# Patient Record
Sex: Male | Born: 1970 | Race: Black or African American | Hispanic: No | Marital: Single
Health system: Midwestern US, Community
[De-identification: ages and names within clinical notes are randomized; demographics above are authoritative.]

---

## 2007-02-26 ENCOUNTER — Encounter: Payer: Self-pay | Admitting: Internal Medicine

## 2007-02-26 DIAGNOSIS — B20 Human immunodeficiency virus [HIV] disease: Secondary | ICD-10-CM | POA: Insufficient documentation

## 2007-03-04 ENCOUNTER — Encounter: Admission: RE | Admit: 2007-03-04 | Discharge: 2007-03-04 | Payer: Self-pay | Admitting: Internal Medicine

## 2007-03-04 ENCOUNTER — Ambulatory Visit: Payer: Self-pay | Admitting: Internal Medicine

## 2007-03-04 LAB — CONVERTED CEMR LAB
ALT: 25 units/L (ref 0–53)
Alkaline Phosphatase: 102 units/L (ref 39–117)
Bilirubin Urine: NEGATIVE
CO2: 23 meq/L (ref 19–32)
Chlamydia, Swab/Urine, PCR: NEGATIVE
Creatinine, Ser: 1.43 mg/dL (ref 0.40–1.50)
GC Probe Amp, Urine: NEGATIVE
HCT: 42 % (ref 39.0–52.0)
HCV Ab: NEGATIVE
HIV-2 Ab: NEGATIVE
HIV: REACTIVE
Hemoglobin: 13.9 g/dL (ref 13.0–17.0)
Hepatitis B Surface Ag: NEGATIVE
Ketones, ur: NEGATIVE mg/dL
Leukocytes, UA: NEGATIVE
Lymphs Abs: 2.6 10*3/uL (ref 0.7–3.3)
MCV: 90.7 fL (ref 78.0–100.0)
Monocytes Relative: 7 % (ref 3–11)
Neutro Abs: 2.5 10*3/uL (ref 1.7–7.7)
Nitrite: NEGATIVE
Protein, ur: NEGATIVE mg/dL
RBC: 4.63 M/uL (ref 4.22–5.81)
RDW: 14 % (ref 11.5–14.0)
Total Protein: 7.6 g/dL (ref 6.0–8.3)
Urobilinogen, UA: 0.2 (ref 0.0–1.0)
WBC: 5.7 10*3/uL (ref 4.0–10.5)

## 2007-03-22 DIAGNOSIS — R569 Unspecified convulsions: Secondary | ICD-10-CM | POA: Insufficient documentation

## 2007-03-22 DIAGNOSIS — I2510 Atherosclerotic heart disease of native coronary artery without angina pectoris: Secondary | ICD-10-CM | POA: Insufficient documentation

## 2007-03-22 DIAGNOSIS — E785 Hyperlipidemia, unspecified: Secondary | ICD-10-CM | POA: Insufficient documentation

## 2007-03-22 DIAGNOSIS — J45909 Unspecified asthma, uncomplicated: Secondary | ICD-10-CM | POA: Insufficient documentation

## 2007-03-25 ENCOUNTER — Emergency Department (HOSPITAL_COMMUNITY): Admission: EM | Admit: 2007-03-25 | Discharge: 2007-03-25 | Payer: Self-pay | Admitting: Emergency Medicine

## 2007-03-27 ENCOUNTER — Telehealth: Payer: Self-pay

## 2007-04-12 ENCOUNTER — Telehealth: Payer: Self-pay | Admitting: Internal Medicine

## 2007-04-15 ENCOUNTER — Telehealth: Payer: Self-pay | Admitting: Internal Medicine

## 2007-05-03 ENCOUNTER — Telehealth: Payer: Self-pay | Admitting: Internal Medicine

## 2007-05-03 ENCOUNTER — Ambulatory Visit: Payer: Self-pay | Admitting: Internal Medicine

## 2007-05-03 ENCOUNTER — Encounter (INDEPENDENT_AMBULATORY_CARE_PROVIDER_SITE_OTHER): Payer: Self-pay | Admitting: *Deleted

## 2007-05-03 DIAGNOSIS — K612 Anorectal abscess: Secondary | ICD-10-CM | POA: Insufficient documentation

## 2007-05-03 DIAGNOSIS — R413 Other amnesia: Secondary | ICD-10-CM | POA: Insufficient documentation

## 2007-05-10 ENCOUNTER — Telehealth: Payer: Self-pay | Admitting: Internal Medicine

## 2007-05-15 ENCOUNTER — Ambulatory Visit: Payer: Self-pay | Admitting: Internal Medicine

## 2007-05-15 ENCOUNTER — Telehealth (INDEPENDENT_AMBULATORY_CARE_PROVIDER_SITE_OTHER): Payer: Self-pay | Admitting: Pharmacy Technician

## 2007-05-15 DIAGNOSIS — R21 Rash and other nonspecific skin eruption: Secondary | ICD-10-CM | POA: Insufficient documentation

## 2007-05-19 ENCOUNTER — Emergency Department (HOSPITAL_COMMUNITY): Admission: EM | Admit: 2007-05-19 | Discharge: 2007-05-19 | Payer: Self-pay | Admitting: Emergency Medicine

## 2007-05-20 ENCOUNTER — Telehealth: Payer: Self-pay

## 2007-05-20 ENCOUNTER — Telehealth: Payer: Self-pay | Admitting: Internal Medicine

## 2007-05-20 LAB — CONVERTED CEMR LAB: RPR Ser Ql: REACTIVE — AB

## 2007-05-21 ENCOUNTER — Ambulatory Visit: Payer: Self-pay | Admitting: Internal Medicine

## 2007-05-28 ENCOUNTER — Ambulatory Visit: Payer: Self-pay | Admitting: Internal Medicine

## 2007-06-04 ENCOUNTER — Ambulatory Visit: Payer: Self-pay | Admitting: Internal Medicine

## 2007-06-17 ENCOUNTER — Encounter (INDEPENDENT_AMBULATORY_CARE_PROVIDER_SITE_OTHER): Payer: Self-pay | Admitting: *Deleted

## 2007-06-18 ENCOUNTER — Encounter (INDEPENDENT_AMBULATORY_CARE_PROVIDER_SITE_OTHER): Payer: Self-pay | Admitting: *Deleted

## 2007-06-24 ENCOUNTER — Encounter: Payer: Self-pay | Admitting: Internal Medicine

## 2007-06-26 ENCOUNTER — Encounter: Payer: Self-pay | Admitting: Internal Medicine

## 2007-06-27 ENCOUNTER — Telehealth: Payer: Self-pay | Admitting: Internal Medicine

## 2007-07-06 ENCOUNTER — Encounter: Payer: Self-pay | Admitting: Internal Medicine

## 2007-07-30 ENCOUNTER — Telehealth: Payer: Self-pay | Admitting: Internal Medicine

## 2007-08-01 ENCOUNTER — Encounter: Admission: RE | Admit: 2007-08-01 | Discharge: 2007-08-01 | Payer: Self-pay | Admitting: Internal Medicine

## 2007-08-01 ENCOUNTER — Ambulatory Visit: Payer: Self-pay | Admitting: Internal Medicine

## 2007-08-01 LAB — CONVERTED CEMR LAB
ALT: 17 units/L (ref 0–53)
Albumin: 4.4 g/dL (ref 3.5–5.2)
BUN: 11 mg/dL (ref 6–23)
Basophils Absolute: 0 10*3/uL (ref 0.0–0.1)
Basophils Relative: 1 % (ref 0–1)
Eosinophils Relative: 6 % — ABNORMAL HIGH (ref 0–5)
Glucose, Bld: 108 mg/dL — ABNORMAL HIGH (ref 70–99)
HDL: 39 mg/dL — ABNORMAL LOW (ref >39)
HIV 1 RNA Quant: <50 copies/mL (ref <50)
HIV-1 RNA Quant, Log: <1.7 (ref <1.70)
LDL Cholesterol: 125 mg/dL — ABNORMAL HIGH (ref 0–99)
Lymphocytes Relative: 44 % (ref 12–46)
MCHC: 32.8 g/dL (ref 30.0–36.0)
MCV: 88.5 fL (ref 78.0–100.0)
Neutrophils Relative %: 46 % (ref 43–77)
Platelets: 158 10*3/uL (ref 150–400)
RBC: 4.89 M/uL (ref 4.22–5.81)
Sodium: 140 meq/L (ref 135–145)
Total Bilirubin: 0.3 mg/dL (ref 0.3–1.2)
Total Protein: 7.2 g/dL (ref 6.0–8.3)
Triglycerides: 183 mg/dL — ABNORMAL HIGH (ref <150)

## 2007-08-16 ENCOUNTER — Ambulatory Visit: Payer: Self-pay | Admitting: Internal Medicine

## 2007-08-16 DIAGNOSIS — R630 Anorexia: Secondary | ICD-10-CM | POA: Insufficient documentation

## 2007-08-21 ENCOUNTER — Ambulatory Visit: Payer: Self-pay | Admitting: Internal Medicine

## 2007-08-21 LAB — CONVERTED CEMR LAB
Glucose, Urine, Semiquant: NEGATIVE
Ketones, urine, test strip: NEGATIVE
WBC Urine, dipstick: NEGATIVE

## 2007-08-28 ENCOUNTER — Telehealth: Payer: Self-pay | Admitting: Internal Medicine

## 2007-08-28 ENCOUNTER — Ambulatory Visit: Payer: Self-pay | Admitting: Internal Medicine

## 2007-08-28 DIAGNOSIS — J209 Acute bronchitis, unspecified: Secondary | ICD-10-CM | POA: Insufficient documentation

## 2007-08-29 ENCOUNTER — Telehealth: Payer: Self-pay | Admitting: Internal Medicine

## 2007-08-30 ENCOUNTER — Encounter (INDEPENDENT_AMBULATORY_CARE_PROVIDER_SITE_OTHER): Payer: Self-pay | Admitting: *Deleted

## 2007-09-16 ENCOUNTER — Encounter: Payer: Self-pay | Admitting: Internal Medicine

## 2007-09-24 ENCOUNTER — Telehealth (INDEPENDENT_AMBULATORY_CARE_PROVIDER_SITE_OTHER): Payer: Self-pay | Admitting: *Deleted

## 2007-09-24 ENCOUNTER — Emergency Department (HOSPITAL_COMMUNITY): Admission: EM | Admit: 2007-09-24 | Discharge: 2007-09-25 | Payer: Self-pay | Admitting: Emergency Medicine

## 2007-10-24 ENCOUNTER — Telehealth (INDEPENDENT_AMBULATORY_CARE_PROVIDER_SITE_OTHER): Payer: Self-pay | Admitting: *Deleted

## 2007-10-26 ENCOUNTER — Emergency Department (HOSPITAL_COMMUNITY): Admission: EM | Admit: 2007-10-26 | Discharge: 2007-10-26 | Payer: Self-pay | Admitting: Emergency Medicine

## 2007-11-14 ENCOUNTER — Encounter: Admission: RE | Admit: 2007-11-14 | Discharge: 2007-11-14 | Payer: Self-pay | Admitting: Internal Medicine

## 2007-11-14 ENCOUNTER — Ambulatory Visit: Payer: Self-pay | Admitting: Internal Medicine

## 2007-11-14 LAB — CONVERTED CEMR LAB
ALT: 39 units/L (ref 0–53)
Basophils Relative: 1 % (ref 0–1)
Chloride: 109 meq/L (ref 96–112)
HIV-1 RNA Quant, Log: 1.71 — ABNORMAL HIGH (ref <1.70)
Lymphocytes Relative: 47 % — ABNORMAL HIGH (ref 12–46)
Lymphs Abs: 3.3 10*3/uL (ref 0.7–4.0)
MCHC: 33.4 g/dL (ref 30.0–36.0)
MCV: 90.2 fL (ref 78.0–100.0)
Monocytes Absolute: 0.4 10*3/uL (ref 0.1–1.0)
Platelets: 210 10*3/uL (ref 150–400)
Potassium: 4.1 meq/L (ref 3.5–5.3)
RDW: 13.5 % (ref 11.5–15.5)
Sodium: 141 meq/L (ref 135–145)
Total Bilirubin: 0.4 mg/dL (ref 0.3–1.2)
Total Protein: 7.4 g/dL (ref 6.0–8.3)
WBC: 7.1 10*3/uL (ref 4.0–10.5)

## 2007-11-29 ENCOUNTER — Ambulatory Visit: Payer: Self-pay | Admitting: Internal Medicine

## 2007-11-29 DIAGNOSIS — H6690 Otitis media, unspecified, unspecified ear: Secondary | ICD-10-CM | POA: Insufficient documentation

## 2007-12-09 ENCOUNTER — Encounter: Payer: Self-pay | Admitting: Internal Medicine

## 2007-12-27 ENCOUNTER — Ambulatory Visit: Payer: Self-pay | Admitting: Internal Medicine

## 2007-12-27 DIAGNOSIS — R869 Unspecified abnormal finding in specimens from male genital organs: Secondary | ICD-10-CM | POA: Insufficient documentation

## 2008-01-02 ENCOUNTER — Telehealth: Payer: Self-pay | Admitting: Internal Medicine

## 2008-01-08 ENCOUNTER — Ambulatory Visit: Payer: Self-pay | Admitting: Internal Medicine

## 2008-01-14 ENCOUNTER — Encounter (INDEPENDENT_AMBULATORY_CARE_PROVIDER_SITE_OTHER): Payer: Self-pay | Admitting: *Deleted

## 2008-01-15 ENCOUNTER — Telehealth (INDEPENDENT_AMBULATORY_CARE_PROVIDER_SITE_OTHER): Payer: Self-pay | Admitting: *Deleted

## 2008-02-03 ENCOUNTER — Telehealth (INDEPENDENT_AMBULATORY_CARE_PROVIDER_SITE_OTHER): Payer: Self-pay | Admitting: *Deleted

## 2008-02-27 ENCOUNTER — Ambulatory Visit: Payer: Self-pay | Admitting: Internal Medicine

## 2008-02-27 LAB — CONVERTED CEMR LAB
ALT: 34 units/L (ref 0–53)
Albumin: 4.8 g/dL (ref 3.5–5.2)
Alkaline Phosphatase: 105 units/L (ref 39–117)
Basophils Absolute: 0.1 10*3/uL (ref 0.0–0.1)
Basophils Relative: 1 % (ref 0–1)
Calcium: 9.5 mg/dL (ref 8.4–10.5)
Chloride: 107 meq/L (ref 96–112)
Eosinophils Relative: 5 % (ref 0–5)
HCT: 45 % (ref 39.0–52.0)
HIV 1 RNA Quant: 234 copies/mL — ABNORMAL HIGH (ref <50)
HIV-1 RNA Quant, Log: 2.37 — ABNORMAL HIGH (ref <1.70)
Hemoglobin: 14.7 g/dL (ref 13.0–17.0)
Lymphs Abs: 2.4 10*3/uL (ref 0.7–4.0)
MCV: 92.6 fL (ref 78.0–100.0)
Monocytes Absolute: 0.4 10*3/uL (ref 0.1–1.0)
Neutro Abs: 2.4 10*3/uL (ref 1.7–7.7)
RBC: 4.86 M/uL (ref 4.22–5.81)
Sodium: 138 meq/L (ref 135–145)
Total Protein: 8 g/dL (ref 6.0–8.3)
WBC: 5.5 10*3/uL (ref 4.0–10.5)

## 2008-03-03 ENCOUNTER — Telehealth: Payer: Self-pay

## 2008-03-06 ENCOUNTER — Ambulatory Visit: Payer: Self-pay | Admitting: Internal Medicine

## 2008-03-06 DIAGNOSIS — K5090 Crohn's disease, unspecified, without complications: Secondary | ICD-10-CM | POA: Insufficient documentation

## 2008-03-18 ENCOUNTER — Ambulatory Visit: Payer: Self-pay | Admitting: Internal Medicine

## 2008-04-13 ENCOUNTER — Ambulatory Visit: Payer: Self-pay | Admitting: Gastroenterology

## 2008-04-15 ENCOUNTER — Encounter: Payer: Self-pay | Admitting: Gastroenterology

## 2008-04-19 ENCOUNTER — Emergency Department (HOSPITAL_COMMUNITY): Admission: EM | Admit: 2008-04-19 | Discharge: 2008-04-19 | Payer: Self-pay | Admitting: Emergency Medicine

## 2008-04-20 ENCOUNTER — Telehealth: Payer: Self-pay | Admitting: Internal Medicine

## 2008-05-05 ENCOUNTER — Encounter: Payer: Self-pay | Admitting: Gastroenterology

## 2008-05-05 ENCOUNTER — Ambulatory Visit: Payer: Self-pay | Admitting: Gastroenterology

## 2008-05-11 ENCOUNTER — Encounter: Payer: Self-pay | Admitting: Gastroenterology

## 2008-05-21 ENCOUNTER — Ambulatory Visit: Payer: Self-pay | Admitting: Gastroenterology

## 2008-05-25 ENCOUNTER — Encounter: Payer: Self-pay | Admitting: Internal Medicine

## 2008-05-27 ENCOUNTER — Telehealth: Payer: Self-pay | Admitting: Gastroenterology

## 2008-06-04 ENCOUNTER — Ambulatory Visit: Payer: Self-pay | Admitting: Internal Medicine

## 2008-06-04 LAB — CONVERTED CEMR LAB
ALT: 33 units/L (ref 0–53)
AST: 19 units/L (ref 0–37)
Albumin: 4.5 g/dL (ref 3.5–5.2)
Basophils Relative: 0 % (ref 0–1)
HCT: 44.5 % (ref 39.0–52.0)
HIV 1 RNA Quant: <48 copies/mL (ref <48)
HIV-1 RNA Quant, Log: <1.68 (ref <1.68)
Hemoglobin: 14.6 g/dL (ref 13.0–17.0)
Lymphs Abs: 3 10*3/uL (ref 0.7–4.0)
Monocytes Relative: 7 % (ref 3–12)
Neutrophils Relative %: 56 % (ref 43–77)
Platelets: 181 10*3/uL (ref 150–400)
RBC: 4.89 M/uL (ref 4.22–5.81)
RDW: 13.9 % (ref 11.5–15.5)
Total Protein: 7.2 g/dL (ref 6.0–8.3)
WBC: 9.1 10*3/uL (ref 4.0–10.5)

## 2008-06-08 ENCOUNTER — Encounter: Payer: Self-pay | Admitting: Internal Medicine

## 2008-06-22 ENCOUNTER — Ambulatory Visit: Payer: Self-pay | Admitting: Gastroenterology

## 2008-06-24 ENCOUNTER — Ambulatory Visit: Payer: Self-pay | Admitting: Internal Medicine

## 2008-06-24 DIAGNOSIS — R369 Urethral discharge, unspecified: Secondary | ICD-10-CM | POA: Insufficient documentation

## 2008-06-24 DIAGNOSIS — F528 Other sexual dysfunction not due to a substance or known physiological condition: Secondary | ICD-10-CM | POA: Insufficient documentation

## 2008-06-25 ENCOUNTER — Ambulatory Visit: Payer: Self-pay | Admitting: Cardiology

## 2008-06-26 ENCOUNTER — Telehealth: Payer: Self-pay | Admitting: Gastroenterology

## 2008-06-26 LAB — CONVERTED CEMR LAB: GC Probe Amp, Urine: NEGATIVE

## 2008-06-29 ENCOUNTER — Encounter (INDEPENDENT_AMBULATORY_CARE_PROVIDER_SITE_OTHER): Payer: Self-pay | Admitting: *Deleted

## 2008-07-03 ENCOUNTER — Telehealth: Payer: Self-pay | Admitting: Internal Medicine

## 2008-07-13 ENCOUNTER — Telehealth (INDEPENDENT_AMBULATORY_CARE_PROVIDER_SITE_OTHER): Payer: Self-pay | Admitting: *Deleted

## 2008-07-15 ENCOUNTER — Encounter (INDEPENDENT_AMBULATORY_CARE_PROVIDER_SITE_OTHER): Payer: Self-pay | Admitting: *Deleted

## 2008-07-20 ENCOUNTER — Ambulatory Visit: Payer: Self-pay | Admitting: Gastroenterology

## 2008-07-22 ENCOUNTER — Encounter: Payer: Self-pay | Admitting: Internal Medicine

## 2008-08-27 ENCOUNTER — Telehealth: Payer: Self-pay | Admitting: Internal Medicine

## 2008-08-27 ENCOUNTER — Ambulatory Visit: Payer: Self-pay | Admitting: Internal Medicine

## 2008-08-27 ENCOUNTER — Other Ambulatory Visit: Payer: Self-pay | Admitting: Internal Medicine

## 2008-08-27 LAB — CONVERTED CEMR LAB
ALT: 38 units/L (ref 0–53)
Basophils Absolute: 0 10*3/uL (ref 0.0–0.1)
Basophils Relative: 0 % (ref 0–1)
CO2: 22 meq/L (ref 19–32)
Calcium: 9 mg/dL (ref 8.4–10.5)
Chloride: 108 meq/L (ref 96–112)
Creatinine, Ser: 1.38 mg/dL (ref 0.40–1.50)
Eosinophils Relative: 2 % (ref 0–5)
HCT: 42 % (ref 39.0–52.0)
HDL: 41 mg/dL (ref >39)
HIV 1 RNA Quant: 66 copies/mL — ABNORMAL HIGH (ref <48)
HIV-1 RNA Quant, Log: 1.82 — ABNORMAL HIGH (ref <1.68)
Lymphocytes Relative: 41 % (ref 12–46)
Lymphs Abs: 2.9 10*3/uL (ref 0.7–4.0)
MCHC: 33.8 g/dL (ref 30.0–36.0)
MCV: 93.5 fL (ref 78.0–100.0)
Monocytes Absolute: 0.6 10*3/uL (ref 0.1–1.0)
Monocytes Relative: 8 % (ref 3–12)
Neutro Abs: 3.4 10*3/uL (ref 1.7–7.7)
Neutrophils Relative %: 48 % (ref 43–77)
Platelets: 155 10*3/uL (ref 150–400)
RDW: 13.7 % (ref 11.5–15.5)
RPR Ser Ql: REACTIVE — AB
RPR Titer: 1:4 {titer}
VLDL: 25 mg/dL (ref 0–40)

## 2008-09-03 ENCOUNTER — Ambulatory Visit: Payer: Self-pay | Admitting: Internal Medicine

## 2008-09-10 ENCOUNTER — Ambulatory Visit: Payer: Self-pay | Admitting: Infectious Diseases

## 2008-09-23 ENCOUNTER — Ambulatory Visit: Payer: Self-pay | Admitting: Internal Medicine

## 2008-09-23 LAB — CONVERTED CEMR LAB
Albumin: 4.5 g/dL (ref 3.5–5.2)
Basophils Absolute: 0 10*3/uL (ref 0.0–0.1)
CO2: 20 meq/L (ref 19–32)
Chloride: 111 meq/L (ref 96–112)
Eosinophils Absolute: 0.3 10*3/uL (ref 0.0–0.7)
Eosinophils Relative: 5 % (ref 0–5)
GFR calc non Af Amer: 53 mL/min — ABNORMAL LOW (ref >60)
HCT: 41.9 % (ref 39.0–52.0)
HIV 1 RNA Quant: <48 copies/mL (ref <48)
Lymphocytes Relative: 43 % (ref 12–46)
Lymphs Abs: 2.3 10*3/uL (ref 0.7–4.0)
MCHC: 35.1 g/dL (ref 30.0–36.0)
Monocytes Relative: 7 % (ref 3–12)
Neutro Abs: 2.3 10*3/uL (ref 1.7–7.7)
Neutrophils Relative %: 44 % (ref 43–77)
Platelets: 142 10*3/uL — ABNORMAL LOW (ref 150–400)
Sodium: 142 meq/L (ref 135–145)
Total Protein: 7.4 g/dL (ref 6.0–8.3)
WBC: 5.3 10*3/uL (ref 4.0–10.5)

## 2008-10-07 ENCOUNTER — Ambulatory Visit: Payer: Self-pay | Admitting: Internal Medicine

## 2008-10-09 ENCOUNTER — Telehealth (INDEPENDENT_AMBULATORY_CARE_PROVIDER_SITE_OTHER): Payer: Self-pay | Admitting: *Deleted

## 2008-10-12 ENCOUNTER — Telehealth: Payer: Self-pay | Admitting: Internal Medicine

## 2008-10-13 ENCOUNTER — Ambulatory Visit: Payer: Self-pay | Admitting: Internal Medicine

## 2008-10-13 DIAGNOSIS — F329 Major depressive disorder, single episode, unspecified: Secondary | ICD-10-CM | POA: Insufficient documentation

## 2008-10-15 ENCOUNTER — Telehealth (INDEPENDENT_AMBULATORY_CARE_PROVIDER_SITE_OTHER): Payer: Self-pay | Admitting: *Deleted

## 2008-12-28 ENCOUNTER — Encounter: Payer: Self-pay | Admitting: Internal Medicine

## 2009-01-05 ENCOUNTER — Ambulatory Visit: Payer: Self-pay | Admitting: Internal Medicine

## 2009-01-05 LAB — CONVERTED CEMR LAB
AST: 21 units/L (ref 0–37)
Basophils Relative: 1 % (ref 0–1)
CO2: 19 meq/L (ref 19–32)
Calcium: 9.1 mg/dL (ref 8.4–10.5)
Creatinine, Ser: 1.54 mg/dL — ABNORMAL HIGH (ref 0.40–1.50)
Eosinophils Relative: 4 % (ref 0–5)
Glucose, Bld: 88 mg/dL (ref 70–99)
Hemoglobin: 13.9 g/dL (ref 13.0–17.0)
Lymphocytes Relative: 51 % — ABNORMAL HIGH (ref 12–46)
Platelets: 174 10*3/uL (ref 150–400)
Potassium: 3.8 meq/L (ref 3.5–5.3)
RBC: 4.38 M/uL (ref 4.22–5.81)
RPR Titer: 1:1 {titer}
Sodium: 140 meq/L (ref 135–145)
Total Bilirubin: 0.3 mg/dL (ref 0.3–1.2)
Total Protein: 6.9 g/dL (ref 6.0–8.3)
WBC: 6.1 10*3/uL (ref 4.0–10.5)

## 2009-01-20 ENCOUNTER — Ambulatory Visit: Payer: Self-pay | Admitting: Internal Medicine

## 2009-01-21 ENCOUNTER — Telehealth (INDEPENDENT_AMBULATORY_CARE_PROVIDER_SITE_OTHER): Payer: Self-pay | Admitting: *Deleted

## 2009-01-29 ENCOUNTER — Telehealth (INDEPENDENT_AMBULATORY_CARE_PROVIDER_SITE_OTHER): Payer: Self-pay | Admitting: *Deleted

## 2009-04-19 ENCOUNTER — Ambulatory Visit: Payer: Self-pay | Admitting: Gastroenterology

## 2009-04-20 ENCOUNTER — Ambulatory Visit: Payer: Self-pay | Admitting: Internal Medicine

## 2009-04-20 LAB — CONVERTED CEMR LAB
AST: 15 units/L (ref 0–37)
Alkaline Phosphatase: 97 units/L (ref 39–117)
BUN: 13 mg/dL (ref 6–23)
Glucose, Bld: 85 mg/dL (ref 70–99)
HCT: 42.9 % (ref 39.0–52.0)
HIV-1 RNA Quant, Log: 2.05 — ABNORMAL HIGH (ref <1.68)
Lymphocytes Relative: 48 % — ABNORMAL HIGH (ref 12–46)
Lymphs Abs: 2.8 10*3/uL (ref 0.7–4.0)
MCHC: 32.4 g/dL (ref 30.0–36.0)
MCV: 94.1 fL (ref >=78.0)
Monocytes Absolute: 0.4 10*3/uL (ref 0.1–1.0)
Neutro Abs: 2.4 10*3/uL (ref 1.7–7.7)
Neutrophils Relative %: 41 % — ABNORMAL LOW (ref 43–77)
Platelets: 153 10*3/uL (ref 150–400)
Potassium: 3.8 meq/L (ref 3.5–5.3)
RDW: 12.8 % (ref 11.5–15.5)
Total Protein: 7.2 g/dL (ref 6.0–8.3)
WBC: 5.8 10*3/uL (ref 4.0–10.5)

## 2009-04-23 ENCOUNTER — Telehealth: Payer: Self-pay | Admitting: Internal Medicine

## 2009-05-06 ENCOUNTER — Emergency Department (HOSPITAL_COMMUNITY): Admission: EM | Admit: 2009-05-06 | Discharge: 2009-05-06 | Payer: Self-pay | Admitting: Emergency Medicine

## 2009-05-17 ENCOUNTER — Telehealth (INDEPENDENT_AMBULATORY_CARE_PROVIDER_SITE_OTHER): Payer: Self-pay | Admitting: *Deleted

## 2009-05-24 ENCOUNTER — Telehealth (INDEPENDENT_AMBULATORY_CARE_PROVIDER_SITE_OTHER): Payer: Self-pay | Admitting: *Deleted

## 2009-05-25 ENCOUNTER — Emergency Department (HOSPITAL_COMMUNITY): Admission: EM | Admit: 2009-05-25 | Discharge: 2009-05-25 | Payer: Self-pay | Admitting: Emergency Medicine

## 2009-05-28 ENCOUNTER — Ambulatory Visit: Payer: Self-pay | Admitting: Internal Medicine

## 2009-05-28 DIAGNOSIS — J040 Acute laryngitis: Secondary | ICD-10-CM | POA: Insufficient documentation

## 2009-06-21 ENCOUNTER — Emergency Department (HOSPITAL_COMMUNITY): Admission: EM | Admit: 2009-06-21 | Discharge: 2009-06-21 | Payer: Self-pay | Admitting: Family Medicine

## 2009-06-21 ENCOUNTER — Ambulatory Visit: Payer: Self-pay | Admitting: Gastroenterology

## 2009-06-22 ENCOUNTER — Encounter: Payer: Self-pay | Admitting: Internal Medicine

## 2009-06-23 ENCOUNTER — Encounter: Payer: Self-pay | Admitting: Internal Medicine

## 2009-06-23 ENCOUNTER — Encounter (INDEPENDENT_AMBULATORY_CARE_PROVIDER_SITE_OTHER): Payer: Self-pay | Admitting: *Deleted

## 2009-07-02 ENCOUNTER — Encounter (INDEPENDENT_AMBULATORY_CARE_PROVIDER_SITE_OTHER): Payer: Self-pay | Admitting: *Deleted

## 2009-07-12 ENCOUNTER — Encounter (INDEPENDENT_AMBULATORY_CARE_PROVIDER_SITE_OTHER): Payer: Self-pay | Admitting: *Deleted

## 2009-07-23 ENCOUNTER — Telehealth (INDEPENDENT_AMBULATORY_CARE_PROVIDER_SITE_OTHER): Payer: Self-pay | Admitting: *Deleted

## 2009-08-16 ENCOUNTER — Ambulatory Visit: Payer: Self-pay | Admitting: Gastroenterology

## 2009-08-23 ENCOUNTER — Telehealth (INDEPENDENT_AMBULATORY_CARE_PROVIDER_SITE_OTHER): Payer: Self-pay | Admitting: *Deleted

## 2009-08-30 ENCOUNTER — Ambulatory Visit: Payer: Self-pay | Admitting: Internal Medicine

## 2009-08-30 LAB — CONVERTED CEMR LAB
AST: 20 units/L (ref 0–37)
Eosinophils Absolute: 0.3 10*3/uL (ref 0.0–0.7)
Glucose, Bld: 94 mg/dL (ref 70–99)
HCT: 43.5 % (ref 39.0–52.0)
HIV-1 RNA Quant, Log: 2.45 — ABNORMAL HIGH (ref <1.68)
Hemoglobin: 14.3 g/dL (ref 13.0–17.0)
Lymphs Abs: 3.7 10*3/uL (ref 0.7–4.0)
MCV: 92.9 fL (ref >=78.0)
Monocytes Absolute: 0.4 10*3/uL (ref 0.1–1.0)
Neutro Abs: 2.6 10*3/uL (ref 1.7–7.7)
Neutrophils Relative %: 37 % — ABNORMAL LOW (ref 43–77)
Platelets: 156 10*3/uL (ref 150–400)
RDW: 14.2 % (ref 11.5–15.5)
Sodium: 140 meq/L (ref 135–145)
Total Protein: 7 g/dL (ref 6.0–8.3)
WBC: 7.1 10*3/uL (ref 4.0–10.5)

## 2009-09-20 ENCOUNTER — Telehealth (INDEPENDENT_AMBULATORY_CARE_PROVIDER_SITE_OTHER): Payer: Self-pay | Admitting: *Deleted

## 2009-09-20 ENCOUNTER — Encounter: Payer: Self-pay | Admitting: Internal Medicine

## 2009-09-22 ENCOUNTER — Ambulatory Visit: Payer: Self-pay | Admitting: Internal Medicine

## 2009-10-11 ENCOUNTER — Telehealth (INDEPENDENT_AMBULATORY_CARE_PROVIDER_SITE_OTHER): Payer: Self-pay | Admitting: *Deleted

## 2009-11-16 ENCOUNTER — Telehealth: Payer: Self-pay | Admitting: Internal Medicine

## 2009-12-01 ENCOUNTER — Ambulatory Visit: Payer: Self-pay | Admitting: Internal Medicine

## 2009-12-01 LAB — CONVERTED CEMR LAB
ALT: 27 units/L (ref 0–53)
Alkaline Phosphatase: 116 units/L (ref 39–117)
BUN: 9 mg/dL (ref 6–23)
Basophils Absolute: 0 10*3/uL (ref 0.0–0.1)
Chloride: 106 meq/L (ref 96–112)
Creatinine, Ser: 1.32 mg/dL (ref 0.40–1.50)
Eosinophils Absolute: 0.3 10*3/uL (ref 0.0–0.7)
Glucose, Bld: 87 mg/dL (ref 70–99)
Lymphocytes Relative: 42 % (ref 12–46)
Lymphs Abs: 2.4 10*3/uL (ref 0.7–4.0)
MCHC: 32.5 g/dL (ref 30.0–36.0)
Monocytes Absolute: 0.4 10*3/uL (ref 0.1–1.0)
Monocytes Relative: 7 % (ref 3–12)
Neutrophils Relative %: 45 % (ref 43–77)
Platelets: 192 10*3/uL (ref 150–400)
Potassium: 4 meq/L (ref 3.5–5.3)
RBC: 5.01 M/uL (ref 4.22–5.81)
Total Bilirubin: 0.4 mg/dL (ref 0.3–1.2)
WBC: 5.7 10*3/uL (ref 4.0–10.5)

## 2009-12-16 ENCOUNTER — Ambulatory Visit: Payer: Self-pay | Admitting: Internal Medicine

## 2010-01-03 ENCOUNTER — Encounter (INDEPENDENT_AMBULATORY_CARE_PROVIDER_SITE_OTHER): Payer: Self-pay | Admitting: *Deleted

## 2010-01-19 ENCOUNTER — Telehealth: Payer: Self-pay

## 2010-01-21 ENCOUNTER — Telehealth (INDEPENDENT_AMBULATORY_CARE_PROVIDER_SITE_OTHER): Payer: Self-pay | Admitting: *Deleted

## 2010-01-26 ENCOUNTER — Encounter: Payer: Self-pay | Admitting: Internal Medicine

## 2010-01-31 ENCOUNTER — Telehealth (INDEPENDENT_AMBULATORY_CARE_PROVIDER_SITE_OTHER): Payer: Self-pay | Admitting: *Deleted

## 2010-03-04 ENCOUNTER — Telehealth (INDEPENDENT_AMBULATORY_CARE_PROVIDER_SITE_OTHER): Payer: Self-pay | Admitting: *Deleted

## 2010-03-28 ENCOUNTER — Encounter: Payer: Self-pay | Admitting: Internal Medicine

## 2010-05-03 ENCOUNTER — Encounter (INDEPENDENT_AMBULATORY_CARE_PROVIDER_SITE_OTHER): Payer: Self-pay | Admitting: *Deleted

## 2010-05-06 ENCOUNTER — Encounter: Payer: Self-pay | Admitting: Internal Medicine
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
## 2010-07-19 NOTE — Miscellaneous (Signed)
Summary: [facility]  [facility]   Imported By: Florinda Marker 01/27/2010 10:51:26  _____________________________________________________________________  External Attachment:    Type:   Image     Comment:   External Document

## 2010-07-19 NOTE — Assessment & Plan Note (Signed)
Summary: 3-WEEK F/U APPT...LSW.   History of Present Illness Visit Type: follow up  Primary GI MD: Melvia Heaps MD [facility] Primary Provider: Yisroel Ramming, MD Requesting Provider: NA Chief Complaint: F/u crohn's.  Pt states that he is feeling better and denies any GI complaints History of Present Illness:   Joshua Moss for followup of his Crohn's disease.  He resumed lialda and ProctoFoam and Rowasa enemas for several weeks and  noted improvement as well.  Since he ran out of the enemas his symptoms have slightly worsened although overall he is considerably better.   GI Review of Systems      Denies abdominal pain, acid reflux, belching, bloating, chest pain, dysphagia with liquids, dysphagia with solids, heartburn, loss of appetite, nausea, vomiting, vomiting blood, weight loss, and  weight gain.        Denies anal fissure, black tarry stools, change in bowel habit, constipation, diarrhea, diverticulosis, fecal incontinence, heme positive stool, hemorrhoids, irritable bowel syndrome, jaundice, light color stool, liver problems, rectal bleeding, and  rectal pain.    Current Medications (verified): 1)  Viramune 200 Mg  Tabs (Nevirapine) .... Take 1 Tablet By Mouth Two Times A Day 2)  Truvada 200-300 Mg  Tabs (Emtricitabine-Tenofovir) .... Qdtab 3)  Albuterol 90 Mcg/act  Aers (Albuterol) .... One Puff Q6 As Needed 4)  Baby Aspirin 81 Mg  Chew (Aspirin) .... Take 1 Tablet By Mouth Once A Day 5)  Fish Oil   Oil (Fish Oil) .... One Twice A Day 6)  Androgel 50 Mg/5gm  Gel (Testosterone) .... Apply Contents of One Packet Daily As Directed 7)  Lialda 1.2 Gm Tbec (Mesalamine) .... 2 Tabs By Mouth Daily 8)  Viagra 50 Mg Tabs (Sildenafil Citrate) .... As Directed 9)  Proctofoam Hc 1-1 %  Foam (Hydrocortisone Ace-Pramoxine) .... Use One in Rectum Every Night At Bedtime For 2 Weeks. 10)  Paxil 20 Mg Tabs (Paroxetine Hcl) .... Take 1 Tablet By Mouth Once A Day 11)  Rowasa 4 Gm Kit  (Mesalamine-Cleanser) .... Take One Per Rectum Q.h.s. 12)  Ensure  Liqd (Nutritional Supplements) .... One Can Two Times A Day 13)  Claritin 10 Mg Tabs (Loratadine) .... Take 1 Tablet By Mouth Once A Day  Allergies (verified): 1)  ! Sulfa  Past History:  Past Medical History: Reviewed history from 04/13/2008 and no changes required. Asthma Coronary artery disease HIV disease Hyperlipidemia Seizure disorder Crohns Disease  Past Surgical History: Reviewed history from 04/19/2009 and no changes required. Rectal Fistula repair  Family History: Reviewed history from 05/21/2008 and no changes required. Family History of CAD Male 1st degree relative <50 Family History Diabetes 1st degree relative No FH of Colon Cancer:  Social History: Reviewed history from 05/21/2008 and no changes required. Never Smoked Alcohol use-no Occupation: Unemployed Illicit Drug Use - no Patient does not get regular exercise.  Daily Caffeine Use  Review of Systems  The patient denies allergy/sinus, anemia, anxiety-new, arthritis/joint pain, back pain, blood in urine, breast changes/lumps, change in vision, confusion, cough, coughing up blood, depression-new, fainting, fatigue, fever, headaches-new, hearing problems, heart murmur, heart rhythm changes, itching, muscle pains/cramps, night sweats, nosebleeds, shortness of breath, skin rash, sleeping problems, sore throat, swelling of feet/legs, swollen lymph glands, thirst - excessive, urination - excessive, urination changes/pain, urine leakage, vision changes, and voice change.    Vital Signs:  Patient profile:   40 year old male Height:      73 inches Weight:  202 pounds BMI:     26.75 BSA:     2.16 Pulse rate:   88 / minute Pulse rhythm:   regular BP sitting:   124 / 80  (left arm) Cuff size:   regular  Vitals Entered By: Ok Anis CMA (June 21, 2009 1:49 PM)   Impression & Recommendations:  Problem # 1:  CROHN'S DISEASE  (ICD-555.9) Assessment Improved  He is Crohn's disease involving the terminal ileum and rectosigmoid.  Symptomatically he is improved with lialda and local topical therapy  Recommendations #1 continue lialda #2 restart ProctoFoam q.h.s. for 2 weeks.  At that point he will reduce it to 3 times a day  Patient Instructions: 1)  Please schedule a follow-up appointment in 6 to 8 weeks.  2)  We are giving you a printed rx today 3)  The medication list was reviewed and reconciled.  All changed / newly prescribed medications were explained.  A complete medication list was provided to the patient / caregiver. Prescriptions: PROCTOFOAM HC 1-1 %  FOAM (HYDROCORTISONE ACE-PRAMOXINE) Use one in rectum every night at bedtime for 2 weeks.  #14 x 2   Entered and Authorized by:   Louis Meckel MD   Signed by:   Louis Meckel MD on 06/21/2009   Method used:   Print then Give to Patient   RxID:   [id number]

## 2010-07-19 NOTE — Letter (Signed)
Summary: [facility]:Physician Statement  [facility]:Physician Statement   Imported By: Florinda Marker 03/31/2010 10:02:49  _____________________________________________________________________  External Attachment:    Type:   Image     Comment:   External Document

## 2010-07-19 NOTE — Progress Notes (Signed)
Summary: Records request from [facility]  Request for records received from [facility]. Request forwarded to [employer]. Dena Chavis  July 23, 2009 4:45 PM

## 2010-07-19 NOTE — Letter (Signed)
Summary: Juanell Fairly: Income Verification  Juanell Fairly: Income Verification   Imported By: Florinda Marker 06/24/2009 15:29:45  _____________________________________________________________________  External Attachment:    Type:   Image     Comment:   External Document

## 2010-07-19 NOTE — Miscellaneous (Signed)
Summary: clinical update/ryan white ([employer] app completed)  Clinical Lists Changes  Observations: Added new observation of FINASSESSDT: 06/23/2009 (06/23/2009 14:43) Added new observation of RW VITAL STA: Active (06/23/2009 14:43)

## 2010-07-19 NOTE — Assessment & Plan Note (Signed)
Summary: F/U/VS   Referring Provider:  n/a Primary Provider:  Yisroel Ramming, MD  CC:  follow-up visit, lab results, c/o nasal congestion, scatchy throat, and left ear pain.  History of Present Illness: Pt c/o scratchy throat, sinus congestion and left ear pain for past 2 days.  No fever or chills.  No cough.  He has not missed any doses of his HIV meds. Crohns is under control.  Preventive Screening-Counseling & Management  Alcohol-Tobacco     Alcohol drinks/day: 0     Smoking Status: never     Passive Smoke Exposure: no  Caffeine-Diet-Exercise     Caffeine use/day: 0     Does Patient Exercise: yes     Type of exercise: push-up, sit-ups     Exercise (avg: min/session): 30-60     Times/week: <3  Hep-HIV-STD-Contraception     HIV Risk: no risk noted  Safety-Violence-Falls     Seat Belt Use: yes      Sexual History:  n/a.        Drug Use:  never.        Blood Transfusions:  no.        Travel History:  none.    Comments: pt declined condoms   Updated Prior Medication List: VIRAMUNE 200 MG  TABS (NEVIRAPINE) Take 1 tablet by mouth two times a day TRUVADA 200-300 MG  TABS (EMTRICITABINE-TENOFOVIR) qdtab ALBUTEROL 90 MCG/ACT  AERS (ALBUTEROL) one puff q6 as needed BABY ASPIRIN 81 MG  CHEW (ASPIRIN) Take 1 tablet by mouth once a day FISH OIL 500 MG CAPS (OMEGA-3 FATTY ACIDS) take two by mouth once daily ANDROGEL 50 MG/5GM  GEL (TESTOSTERONE) Apply contents of one packet daily as directed LIALDA 1.2 GM TBEC (MESALAMINE) 2 tabs by mouth daily VIAGRA 50 MG TABS (SILDENAFIL CITRATE) as directed PROCTOFOAM HC 1-1 %  FOAM (HYDROCORTISONE ACE-PRAMOXINE) Use one in rectum every night at bedtime for 2 weeks. PAXIL 20 MG TABS (PAROXETINE HCL) Take 1 tablet by mouth once a day ROWASA 4 GM KIT (MESALAMINE-CLEANSER) take one per rectum q.h.s. ENSURE  LIQD (NUTRITIONAL SUPPLEMENTS) one can two times a day CLARITIN 10 MG TABS (LORATADINE) Take 1 tablet by mouth once a  day AMOXICILLIN 500 MG CAPS (AMOXICILLIN) Take 1 capsule by mouth three times a day  Current Allergies (reviewed today): ! SULFA Past History:  Past Medical History: Last updated: 04/13/2008 Asthma Coronary artery disease HIV disease Hyperlipidemia Seizure disorder Crohns Disease  Review of Systems  The patient denies anorexia, fever, weight loss, dyspnea on exertion, and prolonged cough.    Vital Signs:  Patient profile:   40 year old male Height:      73 inches (185.42 cm) Weight:      203.4 pounds (92.45 kg) BMI:     26.93 Temp:     98.1 degrees F (36.72 degrees C) oral Pulse rate:   58 / minute BP sitting:   121 / 71  (right arm)  Vitals Entered By: Wendall Mola CMA Duncan Dull) (December 16, 2009 9:02 AM) CC: follow-up visit, lab results, c/o nasal congestion, scatchy throat, left ear pain Is Patient Diabetic? No Pain Assessment Patient in pain? no      Nutritional Status BMI of 25 - 29 = overweight Nutritional Status Detail appetite "up and down"  Have you ever been in a relationship where you felt threatened, hurt or afraid?No   Does patient need assistance? Functional Status Self care Ambulation Normal Comments no missed doses of meds per  patient   Physical Exam  General:  alert, well-developed, well-nourished, and well-hydrated.   Head:  normocephalic and atraumatic.   Ears:  Left TM dull with question of mucoid collection behind TM Mouth:  pharynx pink and moist.   Lungs:  normal breath sounds.      Impression & Recommendations:  Problem # 1:  HIV DISEASE (ICD-042) Pt.s most recent CD4ct was 620 and VL 61 .  Pt instructed to continue the current antiretroviral regimen.  Pt encouraged to take medication regularly and not miss doses.  Pt will f/u in 3 months for repeat blood work and will see me 2 weeks later.  Diagnostics Reviewed:  HIV: HIV positive - AIDS status unknown (01/20/2009)   HIV-Western blot: Positive (03/04/2007)   CD4: 620  (12/02/2009)   WBC: 5.7 (12/01/2009)   PMN (bands): 0 (08/27/2008)   Hgb: 15.2 (12/01/2009)   HCT: 46.8 (12/01/2009)   Platelets: 192 (12/01/2009) HIV-1 RNA: 61 (12/01/2009)   HBSAg: NEG (03/04/2007)  Problem # 2:  OTITIS MEDIA, ACUTE (ICD-382.9) amoxacillin for 10 days refill calritin  Medications Added to Medication List This Visit: 1)  Amoxicillin 500 Mg Caps (Amoxicillin) .... Take 1 capsule by mouth three times a day  Other Orders: Est. Patient Level III (14782) Future Orders: T-CD4SP ([facility]) (CD4SP) ... 03/16/2010 T-HIV Viral Load [phone]) ... 03/16/2010 T-Comprehensive Metabolic Panel [phone]) ... 03/16/2010 T-CBC w/Diff ([id number]) ... 03/16/2010 T-Lipid Profile [phone]) ... 03/16/2010  Patient Instructions: 1)  Please schedule a follow-up appointment in 3 months, 2 weeks after labs.  Prescriptions: AMOXICILLIN 500 MG CAPS (AMOXICILLIN) Take 1 capsule by mouth three times a day  #30 x 0   Entered and Authorized by:   Yisroel Ramming MD   Signed by:   Yisroel Ramming MD on 12/16/2009   Method used:   Print then Give to Patient   RxID:   [id number] CLARITIN 10 MG TABS (LORATADINE) Take 1 tablet by mouth once a day  #30 x 5   Entered and Authorized by:   Yisroel Ramming MD   Signed by:   Yisroel Ramming MD on 12/16/2009   Method used:   Print then Give to Patient   RxID:   [id number]

## 2010-07-19 NOTE — Progress Notes (Signed)
Summary: returning call--no answer  Phone Note Outgoing Call   Caller: Patient Summary of Call: Patient called to say that he was recently discharged from the hospital.  He wanted to know if it was ok to come and pick up his viagra that was ordered for him.  Please call pt at [phone]. Call placed by: Paulo Fruit  BS,CPht II,MPH,  March 04, 2010 4:36 PM Call placed to: Patient Action Taken: Assistance medications ready for pick up Summary of Call: had to leave a message for patient to contact office no answer Initial call taken by: Paulo Fruit  BS,CPht II,MPH,  March 04, 2010 4:38 PM

## 2010-07-19 NOTE — Progress Notes (Signed)
Summary: Pt assist med arrived via PAP 3 months supply [id number]/25/11  Phone Note Refill Request      Prescriptions: VIAGRA 50 MG TABS (SILDENAFIL CITRATE) as directed  #30 x 0   Entered by:   Paulo Fruit  BS,CPht II,MPH   Authorized by:   Yisroel Ramming MD   Signed by:   Paulo Fruit  BS,CPht II,MPH on 11/16/2009   Method used:   Samples Given   RxID:   [id number]   Patient Assist Medication Verification: Medication: Viagra 50mg  Lot# [id number] Exp Date:01 Dec 15 Tech approval:MLD Call placed to patient with message that assistance medications are ready for pick-up. Paulo Fruit  BS,CPht II,MPH  Nov 16, 2009 3:26 PM

## 2010-07-19 NOTE — Miscellaneous (Signed)
  Clinical Lists Changes  Medications: Rx of ENSURE  LIQD (NUTRITIONAL SUPPLEMENTS) one can two times a day;  #2 cases x prn;  Signed;  Entered by: Yisroel Ramming MD;  Authorized by: Yisroel Ramming MD;  Method used: Print then Give to Patient    Prescriptions: ENSURE  LIQD (NUTRITIONAL SUPPLEMENTS) one can two times a day  #2 cases x prn   Entered and Authorized by:   Yisroel Ramming MD   Signed by:   Yisroel Ramming MD on 06/22/2009   Method used:   Print then Give to Patient   RxID:   [id number]

## 2010-07-19 NOTE — Progress Notes (Signed)
Summary: Moved to a different state, req. records   Phone Note Call from Patient   Summary of Call: Pt moving to another state. He is requesting to have her records transferred.   He says he lost his housing here due to not able to pay his bills. He moved to another location. Pt was informed he will need to go to the office in which he is transferring and sign release that should be sent to our office. We will then release records. Pt states he did get approved for his disability and now has medicaid and medicare. Tomasita Morrow RN  January 19, 2010 11:59 AM

## 2010-07-19 NOTE — Progress Notes (Signed)
Summary: Pt assist meds arrived for 3 months supply  Phone Note Refill Request      Prescriptions: VIAGRA 50 MG TABS (SILDENAFIL CITRATE) as directed  #30 x 0   Entered by:   Paulo Fruit  BS,CPht II,MPH   Authorized by:   Yisroel Ramming MD   Signed by:   Paulo Fruit  BS,CPht II,MPH on 09/20/2009   Method used:   Samples Given   RxID:   [id number]   Patient Assist Medication Verification: Medication: Viagra 50mg  GUY#[id number] Exp Date:01 Oct 15 Tech approval:MLD Call placed to patient with message that assistance medications are ready for pick-up. Paulo Fruit  BS,CPht II,MPH  September 20, 2009 11:11 AM

## 2010-07-19 NOTE — Progress Notes (Signed)
Summary: refill for patient assistance drug ordered.  Phone Note Outgoing Call   Caller: Patient Reason for Call: Refill Medication Summary of Call: Received a message from patient to contact him at [phone].  Patient is asking for refills. Initial call taken by: Paulo Fruit  BS,CPht II,MPH,  January 21, 2010 10:26 AM Call placed by: Paulo Fruit  BS,CPht II,MPH,  January 21, 2010 10:26 AM Call placed to: Patient Summary of Call: I spoke to patient who stated that he would like a refill on the viagra.  patient said that he will be hopefully moving to [place] the first of September.  I did inform patient to let me know if and when he moves so I can inform [employer].  I am also able to assist him as much as I can in his transition to another state if he needs it. Initial call taken by: Paulo Fruit  BS,CPht II,MPH,  January 21, 2010 10:30 AM  Follow-up for Phone Call        Patient's viagra has been ordered .  The order will arrive within 7-10 business days. The confirmation number is [id number] Follow-up by: Paulo Fruit  BS,CPht II,MPH,  January 21, 2010 10:59 AM

## 2010-07-19 NOTE — Progress Notes (Signed)
Summary: Records request from [facility]  Request for records received from [facility]. Request forwarded to [employer]. Dena Chavis  August 23, 2009 4:20 PM

## 2010-07-19 NOTE — Assessment & Plan Note (Signed)
Summary: 8-WEEK F/U APPT...LSW.   History of Present Illness Visit Type: Follow-up Visit Primary GI MD: Melvia Heaps MD [facility] Primary Provider: Yisroel Ramming, MD Requesting Provider: NA Chief Complaint: Patient here for follow up of his crohns disease, denies any problems.  History of Present Illness:   Joshua Moss has returned for followup of his Crohn's disease.  Taking lialda daily addition to ProctoFoam.  He is without abdominal pain.  Bowels are erratic.  He may go days without a bowel movement and then have some loose stools.  There is no bleeding.   GI Review of Systems      Denies abdominal pain, acid reflux, belching, bloating, chest pain, dysphagia with liquids, dysphagia with solids, heartburn, loss of appetite, nausea, vomiting, vomiting blood, weight loss, and  weight gain.        Denies anal fissure, black tarry stools, change in bowel habit, constipation, diarrhea, diverticulosis, fecal incontinence, heme positive stool, hemorrhoids, irritable bowel syndrome, jaundice, light color stool, liver problems, rectal bleeding, and  rectal pain.    Current Medications (verified): 1)  Viramune 200 Mg  Tabs (Nevirapine) .... Take 1 Tablet By Mouth Two Times A Day 2)  Truvada 200-300 Mg  Tabs (Emtricitabine-Tenofovir) .... Qdtab 3)  Albuterol 90 Mcg/act  Aers (Albuterol) .... One Puff Q6 As Needed 4)  Baby Aspirin 81 Mg  Chew (Aspirin) .... Take 1 Tablet By Mouth Once A Day 5)  Fish Oil 500 Mg Caps (Omega-3 Fatty Acids) .... Take Two By Mouth Once Daily 6)  Androgel 50 Mg/5gm  Gel (Testosterone) .... Apply Contents of One Packet Daily As Directed 7)  Lialda 1.2 Gm Tbec (Mesalamine) .... 2 Tabs By Mouth Daily 8)  Viagra 50 Mg Tabs (Sildenafil Citrate) .... As Directed 9)  Proctofoam Hc 1-1 %  Foam (Hydrocortisone Ace-Pramoxine) .... Use One in Rectum Every Night At Bedtime For 2 Weeks. 10)  Paxil 20 Mg Tabs (Paroxetine Hcl) .... Take 1 Tablet By Mouth Once A Day 11)  Rowasa 4 Gm Kit  (Mesalamine-Cleanser) .... Take One Per Rectum Q.h.s. 12)  Ensure  Liqd (Nutritional Supplements) .... One Can Two Times A Day 13)  Claritin 10 Mg Tabs (Loratadine) .... Take 1 Tablet By Mouth Once A Day  Allergies (verified): 1)  ! Sulfa  Past History:  Past Medical History: Last updated: 04/13/2008 Asthma Coronary artery disease HIV disease Hyperlipidemia Seizure disorder Crohns Disease  Past Surgical History: Last updated: 04/19/2009 Rectal Fistula repair  Family History: Last updated: 05/21/2008 Family History of CAD Male 1st degree relative <50 Family History Diabetes 1st degree relative No FH of Colon Cancer:  Social History: Last updated: 05/21/2008 Never Smoked Alcohol use-no Occupation: Unemployed Illicit Drug Use - no Patient does not get regular exercise.  Daily Caffeine Use  Review of Systems  The patient denies allergy/sinus, anemia, anxiety-new, arthritis/joint pain, back pain, blood in urine, breast changes/lumps, change in vision, confusion, cough, coughing up blood, depression-new, fainting, fatigue, fever, headaches-new, hearing problems, heart murmur, heart rhythm changes, itching, menstrual pain, muscle pains/cramps, night sweats, nosebleeds, pregnancy symptoms, shortness of breath, skin rash, sleeping problems, sore throat, swelling of feet/legs, swollen lymph glands, thirst - excessive , urination - excessive , urination changes/pain, urine leakage, vision changes, and voice change.    Vital Signs:  Patient profile:   40 year old male Height:      73 inches Weight:      205 pounds BMI:     27.14 Pulse rate:   80 /  minute Pulse rhythm:   regular BP sitting:   120 / 72  (left arm) Cuff size:   regular  Vitals Entered By: Harlow Mares CMA Duncan Dull) (August 16, 2009 9:04 AM)   Impression & Recommendations:  Problem # 1:  CROHN'S DISEASE (ICD-555.9) Assessment Improved ES Crohn's disease involving the distal colon and terminal ileum.   Symptoms are well-controlled with lialda.  Plan to continue with the same.  Problem # 2:  HIV DISEASE (ICD-042) Assessment: Comment Only  Problem # 3:  CORONARY ARTERY DISEASE (ICD-414.00) Assessment: Comment Only  Patient Instructions: 1)  cc Dr. Philipp Deputy

## 2010-07-19 NOTE — Miscellaneous (Signed)
Summary: Dr. Fabiola Backer   Dr. Fabiola Backer   Imported By: Florinda Marker 05/10/2010 11:43:08  _____________________________________________________________________  External Attachment:    Type:   Image     Comment:   External Document

## 2010-07-19 NOTE — Miscellaneous (Signed)
  Clinical Lists Changes  Medications: Rx of ENSURE  LIQD (NUTRITIONAL SUPPLEMENTS) one can two times a day;  #2 cases x prn;  Signed;  Entered by: Yisroel Ramming MD;  Authorized by: Yisroel Ramming MD;  Method used: Print then Give to Patient    Prescriptions: ENSURE  LIQD (NUTRITIONAL SUPPLEMENTS) one can two times a day  #2 cases x prn   Entered and Authorized by:   Yisroel Ramming MD   Signed by:   Yisroel Ramming MD on 06/22/2009   Method used:   Print then Give to Patient   RxID:   [id number]

## 2010-07-19 NOTE — Progress Notes (Signed)
Summary: Refill reorder of medication for 3 months-arriv after 11/06/09  Refill reorder of patient's Viagra via [employer] patient assistance program for 3 months of medication.  It will arrive to our clinic within 7-10 days from Nov 06, 2009.  The confirmation number is [id number]. Paulo Fruit  BS,CPht II,MPH  October 11, 2009 2:30 PM

## 2010-07-19 NOTE — Miscellaneous (Signed)
Summary: RW Update  Clinical Lists Changes  Observations: Added new observation of HIV RISK BEH: MSM (07/02/2009 14:45)

## 2010-07-19 NOTE — Letter (Signed)
Summary: Office Visit Letter  [facility]  [address] [place], [place] [address]   Phone: [phone]  Fax: [phone]      January 03, 2010 MRN: [id number]   Joshua Moss [address] [place], [place]  [address]   Dear Mr. LOPATA,   According to our records, it is time for you to schedule a follow-up office visit with [place] in the month of September 2011.   At your convenience, please call [phone] (option #2)to schedule an office visit. If you have any questions, concerns, or feel that this letter is in error, we would appreciate your call.   Sincerely,   Barbette Hair. Arlyce Dice, M.D.  [facility] [phone]

## 2010-07-19 NOTE — Miscellaneous (Signed)
Summary: Release To Pt.  Release To Pt.   Imported By: Florinda Marker 09/29/2009 09:32:21  _____________________________________________________________________  External Attachment:    Type:   Image     Comment:   External Document

## 2010-07-19 NOTE — Assessment & Plan Note (Signed)
Summary: 3months f/u [mkj]   Referring Provider:  n/a Primary Provider:  Yisroel Ramming, MD  CC:  follow-up visit, complaining of congestion, and Depression.  History of Present Illness: Pt states that his crohns has been bothering him a lot.  He is on some new medication for it.  He has also been under a lot of stress due to family issues. No missed doses of his HIV meds. He c/o sinus congestion and cough productive of green sputum for several days. No feve or chills.  Depression History:      The patient denies a depressed mood most of the day and a diminished interest in his usual daily activities.        The patient denies that he feels like life is not worth living, denies that he wishes that he were dead, and denies that he has thought about ending his life.        Preventive Screening-Counseling & Management  Alcohol-Tobacco     Alcohol drinks/day: 0     Smoking Status: never     Passive Smoke Exposure: no  Caffeine-Diet-Exercise     Caffeine use/day: 3     Does Patient Exercise: yes     Type of exercise: push-up, sit-ups     Exercise (avg: min/session): 30-60     Times/week: 3  Hep-HIV-STD-Contraception     HIV Risk: no risk noted  Safety-Violence-Falls     Seat Belt Use: yes      Sexual History:  n/a.        Drug Use:  never.        Blood Transfusions:  no.        Travel History:  none.     Updated Prior Medication List: VIRAMUNE 200 MG  TABS (NEVIRAPINE) Take 1 tablet by mouth two times a day TRUVADA 200-300 MG  TABS (EMTRICITABINE-TENOFOVIR) qdtab ALBUTEROL 90 MCG/ACT  AERS (ALBUTEROL) one puff q6 as needed BABY ASPIRIN 81 MG  CHEW (ASPIRIN) Take 1 tablet by mouth once a day FISH OIL 500 MG CAPS (OMEGA-3 FATTY ACIDS) take two by mouth once daily ANDROGEL 50 MG/5GM  GEL (TESTOSTERONE) Apply contents of one packet daily as directed LIALDA 1.2 GM TBEC (MESALAMINE) 2 tabs by mouth daily VIAGRA 50 MG TABS (SILDENAFIL CITRATE) as directed PROCTOFOAM HC 1-1 %   FOAM (HYDROCORTISONE ACE-PRAMOXINE) Use one in rectum every night at bedtime for 2 weeks. PAXIL 20 MG TABS (PAROXETINE HCL) Take 1 tablet by mouth once a day ROWASA 4 GM KIT (MESALAMINE-CLEANSER) take one per rectum q.h.s. ENSURE  LIQD (NUTRITIONAL SUPPLEMENTS) one can two times a day CLARITIN 10 MG TABS (LORATADINE) Take 1 tablet by mouth once a day ZITHROMAX Z-PAK 250 MG TABS (AZITHROMYCIN) take as directed  Current Allergies (reviewed today): ! SULFA Past History:  Past Medical History: Last updated: 04/13/2008 Asthma Coronary artery disease HIV disease Hyperlipidemia Seizure disorder Crohns Disease  Review of Systems  The patient denies anorexia, fever, weight loss, dyspnea on exertion, and hemoptysis.    Vital Signs:  Patient profile:   40 year old male Height:      73 inches (185.42 cm) Weight:      209.8 pounds (95.36 kg) BMI:     27.78 Temp:     98.0 degrees F (36.67 degrees C) oral Pulse rate:   73 / minute BP sitting:   125 / 74  (right arm)  Vitals Entered By: Wendall Mola CMA Duncan Dull) (September 22, 2009 9:12 AM) CC: follow-up  visit, complaining of congestion, Depression Is Patient Diabetic? No Pain Assessment Patient in pain? yes     Location: stomach Intensity: 5 Type: sharp Onset of pain  Intermittent Nutritional Status BMI of 25 - 29 = overweight Nutritional Status Detail appetite "low"  Does patient need assistance? Functional Status Self care Ambulation Normal Comments no missed doses of meds per patient   Physical Exam  General:  alert, well-developed, well-nourished, and well-hydrated.   Head:  normocephalic and atraumatic.   Mouth:  pharynx pink and moist.   Lungs:  normal breath sounds.      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  Pt instructed to continue the current antiretroviral regimen.  Pt encouraged to take medication regularly and not miss doses.   Pt will f/u in 3 months for repeat blood work and will see me 2 weeks later.  Diagnostics Reviewed:  HIV: HIV positive - AIDS status unknown (01/20/2009)   HIV-Western blot: Positive (03/04/2007)   CD4: 960 (08/31/2009)   WBC: 7.1 (08/30/2009)   PMN (bands): 0 (08/27/2008)   Hgb: 14.3 (08/30/2009)   HCT: 43.5 (08/30/2009)   Platelets: 156 (08/30/2009) HIV-1 RNA: 284 (08/30/2009)   HBSAg: NEG (03/04/2007)  Problem # 2:  ACUTE BRONCHITIS (ICD-466.0)  His updated medication list for this problem includes:    Albuterol 90 Mcg/act Aers (Albuterol) ..... One puff q6 as needed    Zithromax Z-pak 250 Mg Tabs (Azithromycin) .Marland Kitchen... Take as directed  Medications Added to Medication List This Visit: 1)  Zithromax Z-pak 250 Mg Tabs (Azithromycin) .... Take as directed  Other Orders: Est. Patient Level III (91478) Future Orders: T-CD4SP ([facility]) (CD4SP) ... 12/21/2009 T-HIV Viral Load [id number]) ... 12/21/2009 T-Comprehensive Metabolic Panel [phone]) ... 12/21/2009 T-CBC w/Diff ([id number]) ... 12/21/2009  Patient Instructions: 1)  Please schedule a follow-up appointment in 3 months, 2 weeks after labs.  Prescriptions: ZITHROMAX Z-PAK 250 MG TABS (AZITHROMYCIN) take as directed  #1 pack x 0   Entered and Authorized by:   Yisroel Ramming MD   Signed by:   Yisroel Ramming MD on 09/22/2009   Method used:   Print then Give to Patient   RxID:   [id number] CLARITIN 10 MG TABS (LORATADINE) Take 1 tablet by mouth once a day  #30 x 5   Entered and Authorized by:   Yisroel Ramming MD   Signed by:   Yisroel Ramming MD on 09/22/2009   Method used:   Print then Give to Patient   RxID:   [id number] ENSURE  LIQD (NUTRITIONAL SUPPLEMENTS) one can two times a day  #2 cases x prn   Entered and Authorized by:   Yisroel Ramming MD   Signed by:   Yisroel Ramming MD on 09/22/2009   Method used:   Print then Give to Patient   RxID:   [id number]

## 2010-07-19 NOTE — Miscellaneous (Signed)
Summary: Pt. has relocated to [place], [place], 01/2010  Clinical Lists Changes  Observations: Added new observation of RW VITAL STA: Relocated (05/03/2010 11:16)

## 2010-07-19 NOTE — Progress Notes (Signed)
Summary: Pt. assist med arrived 3 mos--unable to reach pt #disc  Phone Note Refill Request      Prescriptions: VIAGRA 50 MG TABS (SILDENAFIL CITRATE) as directed  #30 x 0   Entered by:   Paulo Fruit  BS,CPht II,MPH   Authorized by:   Yisroel Ramming MD   Signed by:   Paulo Fruit  BS,CPht II,MPH on 01/31/2010   Method used:   Samples Given   RxID:   [id number]  Patient Assist Medication Verification: Medication name: Viagra 50mg  RX # [id number] Tech approval:MLD Tried to contact patient to let him know his medication has arrived.  Unable to reach patient on both numbers we have on file in EMR; numbers are not valid and disconnected. Paulo Fruit  BS,CPht II,MPH  January 31, 2010 12:47 PM

## 2010-07-19 NOTE — Miscellaneous (Signed)
Summary: clinical update/ryan white NCADAP approved til 09/17/10  Clinical Lists Changes  Observations: Added new observation of AIDSDAP: Yes 2011 (07/12/2009 10:31)

## 2010-08-09 ENCOUNTER — Encounter (INDEPENDENT_AMBULATORY_CARE_PROVIDER_SITE_OTHER): Payer: Self-pay | Admitting: *Deleted

## 2010-08-16 NOTE — Miscellaneous (Signed)
  Clinical Lists Changes  Observations: Added new observation of HIV STATUS: HIV positive - not AIDS (08/09/2010 16:00)

## 2010-09-04 LAB — T-HELPER CELL (CD4) - (RCID CLINIC ONLY): CD4 % Helper T Cell: 27 % — ABNORMAL LOW (ref 33–55)

## 2010-09-11 LAB — T-HELPER CELL (CD4) - (RCID CLINIC ONLY)
CD4 % Helper T Cell: 29 % — ABNORMAL LOW (ref 33–55)
CD4 T Cell Abs: 960 uL (ref 400–2700)

## 2010-09-20 LAB — GC/CHLAMYDIA PROBE AMP, GENITAL: Chlamydia, DNA Probe: NEGATIVE

## 2010-09-21 LAB — T-HELPER CELL (CD4) - (RCID CLINIC ONLY): CD4 T Cell Abs: 710 uL (ref 400–2700)

## 2010-09-25 LAB — T-HELPER CELL (CD4) - (RCID CLINIC ONLY): CD4 % Helper T Cell: 30 % — ABNORMAL LOW (ref 33–55)

## 2010-09-28 LAB — T-HELPER CELL (CD4) - (RCID CLINIC ONLY): CD4 T Cell Abs: 630 uL (ref 400–2700)

## 2010-09-29 LAB — T-HELPER CELL (CD4) - (RCID CLINIC ONLY)
CD4 % Helper T Cell: 27 % — ABNORMAL LOW (ref 33–55)
CD4 T Cell Abs: 780 uL (ref 400–2700)

## 2011-03-10 LAB — T-HELPER CELL (CD4) - (RCID CLINIC ONLY): CD4 T Cell Abs: 620

## 2011-03-15 LAB — T-HELPER CELL (CD4) - (RCID CLINIC ONLY): CD4 % Helper T Cell: 27 — ABNORMAL LOW

## 2011-03-22 LAB — T-HELPER CELL (CD4) - (RCID CLINIC ONLY): CD4 T Cell Abs: 600

## 2011-03-24 LAB — T-HELPER CELL (CD4) - (RCID CLINIC ONLY): CD4 % Helper T Cell: 29 % — ABNORMAL LOW (ref 33–55)

## 2012-02-20 ENCOUNTER — Encounter: Payer: Self-pay | Admitting: Gastroenterology

## 2014-02-13 ENCOUNTER — Encounter: Payer: Self-pay | Admitting: Gastroenterology

## 2014-04-21 ENCOUNTER — Encounter (HOSPITAL_COMMUNITY): Payer: Self-pay

## 2014-04-21 ENCOUNTER — Emergency Department (HOSPITAL_COMMUNITY)
Admission: EM | Admit: 2014-04-21 | Discharge: 2014-04-21 | Disposition: A | Discharge status: Home / Self Care | Payer: Medicare (Managed Care) | Attending: Emergency Medicine | Admitting: Emergency Medicine

## 2014-04-21 DIAGNOSIS — R300 Dysuria: Secondary | ICD-10-CM | POA: Diagnosis not present

## 2014-04-21 DIAGNOSIS — R369 Urethral discharge, unspecified: Secondary | ICD-10-CM

## 2014-04-21 DIAGNOSIS — R360 Urethral discharge without blood: Secondary | ICD-10-CM | POA: Insufficient documentation

## 2014-04-21 LAB — URINALYSIS, ROUTINE W REFLEX MICROSCOPIC
BILIRUBIN URINE: NEGATIVE
Glucose, UA: NEGATIVE mg/dL
HGB URINE DIPSTICK: NEGATIVE
KETONES UR: NEGATIVE mg/dL
Leukocytes, UA: NEGATIVE
NITRITE: NEGATIVE
PH: 5 (ref 5.0–8.0)
Protein, ur: NEGATIVE mg/dL
Specific Gravity, Urine: 1.019 (ref 1.005–1.030)
Urobilinogen, UA: 0.2 mg/dL (ref 0.0–1.0)

## 2014-04-21 MED ORDER — LIDOCAINE HCL (PF) 1 % IJ SOLN
2.0000 mL | Freq: Once | INTRAMUSCULAR | Status: AC
  Administered 2014-04-21: 2 mL
  Filled 2014-04-21: qty 5

## 2014-04-21 MED ORDER — CEFTRIAXONE SODIUM 250 MG IJ SOLR
250.0000 mg | Freq: Once | INTRAMUSCULAR | Status: AC
  Administered 2014-04-21: 250 mg via INTRAMUSCULAR
  Filled 2014-04-21: qty 250

## 2014-04-21 MED ORDER — AZITHROMYCIN 250 MG PO TABS
1000.0000 mg | ORAL_TABLET | Freq: Once | ORAL | Status: AC
  Administered 2014-04-21: 1000 mg via ORAL
  Filled 2014-04-21: qty 4

## 2014-04-21 NOTE — ED Provider Notes (Signed)
CSN: [id number]     Arrival date & time 04/21/14  1346 History  This chart was scribed for non-physician practitioner, Emilia BeckKaitlyn Nayelli Inglis, PA-C, working with Joya Gaskinsonald W Wickline, MD by Milly JakobJohn Lee Graves, ED Scribe. The patient was seen in room TR10C/TR10C. Patient's care was started at 2:58 PM.    Chief Complaint  Patient presents with  . Penile Discharge   Patient is a 43 y.o. male presenting with penile discharge. The history is provided by the patient. No language interpreter was used.  Penile Discharge This is a new problem. The current episode started 2 days ago. The problem occurs constantly. The problem has not changed since onset.Pertinent negatives include no chest pain, no abdominal pain, no headaches and no shortness of breath. Nothing aggravates the symptoms. Nothing relieves the symptoms. He has tried nothing for the symptoms.   HPI Comments: Joshua Moss is a 43 y.o. male who presents to the Emergency Department complaining of yellow and green penile discharge onset 2 days ago. He reports associated dysuria. He is expressing concern about an STD. He denies any new sexual partners. He denies any aditonal medical problems.   History reviewed. No pertinent past medical history. History reviewed. No pertinent past surgical history. History reviewed. No pertinent family history. History  Substance Use Topics  . Smoking status: Never Smoker   . Smokeless tobacco: Not on file  . Alcohol Use: No    Review of Systems  Respiratory: Negative for shortness of breath.   Cardiovascular: Negative for chest pain.  Gastrointestinal: Negative for abdominal pain.  Genitourinary: Positive for dysuria and discharge.  Neurological: Negative for headaches.  All other systems reviewed and are negative.  Allergies  Sulfonamide derivatives  Home Medications   Prior to Admission medications   Not on File   Triage Vitals: BP 119/69 mmHg  Pulse 54  Temp(Src) 97.7 F (36.5 C) (Oral)  Resp  18  Ht 6' (1.829 m)  Wt 185 lb (83.915 kg)  BMI 25.08 kg/m2  SpO2 98% Physical Exam  Constitutional: He is oriented to person, place, and time. He appears well-developed and well-nourished. No distress.  HENT:  Head: Normocephalic and atraumatic.  Eyes: Conjunctivae and EOM are normal.  Neck: Neck supple. No tracheal deviation present.  Cardiovascular: Normal rate.   Pulmonary/Chest: Effort normal. No respiratory distress.  Musculoskeletal: Normal range of motion.  Neurological: He is alert and oriented to person, place, and time.  Skin: Skin is warm and dry.  Psychiatric: He has a normal mood and affect. His behavior is normal.  Nursing note and vitals reviewed.   ED Course  Procedures (including critical care time) DIAGNOSTIC STUDIES: Oxygen Saturation is 98% on room air, normal by my interpretation.    COORDINATION OF CARE: 3:03 PM-Discussed treatment plan which includes UA, Rocephin, and Zithromax with pt at bedside and pt agreed to plan.   Labs Review Labs Reviewed  GC/CHLAMYDIA PROBE AMP  URINALYSIS, ROUTINE W REFLEX MICROSCOPIC    Imaging Review No results found.   EKG Interpretation None      MDM   Final diagnoses:  Penile discharge    Patient will be treated for gc/chlamydia. Patient's swab pending and he will be contacted in 48 hours if results are positive. No further evaluation needed.   I personally performed the services described in this documentation, which was scribed in my presence. The recorded information has been reviewed and is accurate.   Emilia BeckKaitlyn Kelee Cunningham, PA-C 04/23/14 0036  Joya Gaskinsonald W Wickline, MD 04/23/14  1325 

## 2014-04-21 NOTE — Discharge Instructions (Signed)
You have been treated for gonorrhea and chlamydia here. You will be contacted in 48 hours if your results are positive. Refer to attached documents for more information.

## 2014-04-21 NOTE — ED Notes (Signed)
Pt states he noticed some burning with urination yesterday and having penile discharge.

## 2014-04-22 LAB — GC/CHLAMYDIA PROBE AMP
CT Probe RNA: NEGATIVE
GC Probe RNA: NEGATIVE

## 2015-04-30 ENCOUNTER — Encounter (HOSPITAL_COMMUNITY): Payer: Self-pay | Admitting: Emergency Medicine

## 2015-04-30 ENCOUNTER — Emergency Department (HOSPITAL_COMMUNITY)
Admission: EM | Admit: 2015-04-30 | Discharge: 2015-04-30 | Disposition: A | Discharge status: Home / Self Care | Payer: Medicare Other | Attending: Emergency Medicine | Admitting: Emergency Medicine

## 2015-04-30 DIAGNOSIS — Z202 Contact with and (suspected) exposure to infections with a predominantly sexual mode of transmission: Secondary | ICD-10-CM | POA: Diagnosis not present

## 2015-04-30 DIAGNOSIS — Z711 Person with feared health complaint in whom no diagnosis is made: Secondary | ICD-10-CM

## 2015-04-30 DIAGNOSIS — R300 Dysuria: Secondary | ICD-10-CM

## 2015-04-30 DIAGNOSIS — R369 Urethral discharge, unspecified: Secondary | ICD-10-CM

## 2015-04-30 LAB — URINALYSIS, ROUTINE W REFLEX MICROSCOPIC
Glucose, UA: NEGATIVE mg/dL
Hgb urine dipstick: NEGATIVE
Ketones, ur: NEGATIVE mg/dL
Leukocytes, UA: NEGATIVE
NITRITE: NEGATIVE
Protein, ur: NEGATIVE mg/dL
SPECIFIC GRAVITY, URINE: 1.035 — AB (ref 1.005–1.030)
UROBILINOGEN UA: 1 mg/dL (ref 0.0–1.0)
pH: 5.5 (ref 5.0–8.0)

## 2015-04-30 LAB — RPR: RPR Ser Ql: NONREACTIVE

## 2015-04-30 LAB — GC/CHLAMYDIA PROBE AMP (~~LOC~~) NOT AT ARMC
Chlamydia: NEGATIVE
Neisseria Gonorrhea: NEGATIVE

## 2015-04-30 MED ORDER — CEFTRIAXONE SODIUM 250 MG IJ SOLR
250.0000 mg | Freq: Once | INTRAMUSCULAR | Status: AC
  Administered 2015-04-30: 250 mg via INTRAMUSCULAR
  Filled 2015-04-30: qty 250

## 2015-04-30 MED ORDER — AZITHROMYCIN 250 MG PO TABS
1000.0000 mg | ORAL_TABLET | Freq: Once | ORAL | Status: AC
  Administered 2015-04-30: 1000 mg via ORAL
  Filled 2015-04-30: qty 4

## 2015-04-30 NOTE — ED Notes (Signed)
Pt from home c/o penile drainage and dysuria. He reports two weeks ago while having intercourse the condom broke.

## 2015-04-30 NOTE — ED Provider Notes (Signed)
0120 - Patient with negative UA; no leukocytes, blood, or nitrites. Will d/c with return precautions. Patient empirically treated in the ED.  Results for orders placed or performed during the hospital encounter of 04/30/15  Urinalysis, Routine w reflex microscopic (not at [facility])  Result Value Ref Range   Color, Urine YELLOW YELLOW   APPearance CLEAR CLEAR   Specific Gravity, Urine 1.035 (H) 1.005 - 1.030   pH 5.5 5.0 - 8.0   Glucose, UA NEGATIVE NEGATIVE mg/dL   Hgb urine dipstick NEGATIVE NEGATIVE   Bilirubin Urine SMALL (A) NEGATIVE   Ketones, ur NEGATIVE NEGATIVE mg/dL   Protein, ur NEGATIVE NEGATIVE mg/dL   Urobilinogen, UA 1.0 0.0 - 1.0 mg/dL   Nitrite NEGATIVE NEGATIVE   Leukocytes, UA NEGATIVE NEGATIVE    Antony MaduraKelly Anabeth Chilcott, PA-C 04/30/15 0124  Layla MawKristen N Ward, DO 04/30/15 0222

## 2015-04-30 NOTE — Discharge Instructions (Signed)
Follow up with [facility]D clinic for future STD concerns or screenings. This is the recommendation by the CDC for people with multiple sexual partners or hx of STDs. You have been treated for gonorrhea and chlamydia in the ER but the hospital will call you if lab is positive. You were tested for HIV and Syphilis, and the hospital will call you if the lab is positive.  ° ° °

## 2015-04-30 NOTE — ED Provider Notes (Signed)
CSN: [id number]     Arrival date & time 04/30/15  0018 History   First MD Initiated Contact with Patient 04/30/15 0028     Chief Complaint  Patient presents with  . STD check      (Consider location/radiation/quality/duration/timing/severity/associated sxs/prior Treatment) HPI Comments: Joshua Moss is a 44 y.o. male who presents to the ED with complaints of 4 days of green penile discharge and burning dysuria with urination. He is concerned about STDs given the fact that 2 weeks ago a condom broke during intercourse. He is sexually active with 2 male partners in the last 1 year, typically wears condoms for protection. He denies fevers, chills, CP, SOB, abd pain, N/V/D/C, hematuria, flank pain, urinary frequency, rectal pain, testicular pain/swelling, genital lesions, myalgias, arthralgias, numbness, tingling, weakness, or rashes.   Patient is a 44 y.o. male presenting with penile discharge. The history is provided by the patient. No language interpreter was used.  Penile Discharge This is a new problem. The current episode started in the past 7 days. The problem occurs constantly. The problem has been unchanged. Associated symptoms include urinary symptoms. Pertinent negatives include no abdominal pain, arthralgias, chest pain, chills, fever, myalgias, nausea, numbness, vomiting or weakness. Nothing aggravates the symptoms. He has tried nothing for the symptoms. The treatment provided no relief.    History reviewed. No pertinent past medical history. History reviewed. No pertinent past surgical history. No family history on file. Social History  Substance Use Topics  . Smoking status: Never Smoker   . Smokeless tobacco: None  . Alcohol Use: No    Review of Systems  Constitutional: Negative for fever and chills.  Respiratory: Negative for shortness of breath.   Cardiovascular: Negative for chest pain.  Gastrointestinal: Negative for nausea, vomiting, abdominal pain, diarrhea,  constipation and rectal pain.  Genitourinary: Positive for dysuria and discharge. Negative for frequency, hematuria, flank pain, scrotal swelling, genital sores and testicular pain.  Musculoskeletal: Negative for myalgias and arthralgias.  Skin: Negative for color change.  Allergic/Immunologic: Negative for immunocompromised state.  Neurological: Negative for weakness and numbness.  Psychiatric/Behavioral: Negative for confusion.   10 Systems reviewed and are negative for acute change except as noted in the HPI.    Allergies  Sulfonamide derivatives  Home Medications   Prior to Admission medications   Not on File   BP 125/60 mmHg  Pulse 61  Temp(Src) 97.6 F (36.4 C) (Oral)  Resp 20  SpO2 99% Physical Exam  Constitutional: He is oriented to person, place, and time. Vital signs are normal. He appears well-developed and well-nourished.  Non-toxic appearance. No distress.  Afebrile, nontoxic, NAD  HENT:  Head: Normocephalic and atraumatic.  Mouth/Throat: Oropharynx is clear and moist and mucous membranes are normal.  Eyes: Conjunctivae and EOM are normal. Right eye exhibits no discharge. Left eye exhibits no discharge.  Neck: Normal range of motion. Neck supple.  Cardiovascular: Normal rate, regular rhythm, normal heart sounds and intact distal pulses.  Exam reveals no gallop and no friction rub.   No murmur heard. Pulmonary/Chest: Effort normal and breath sounds normal. No respiratory distress. He has no decreased breath sounds. He has no wheezes. He has no rhonchi. He has no rales.  Abdominal: Soft. Normal appearance and bowel sounds are normal. He exhibits no distension. There is no tenderness. There is no rigidity, no rebound, no guarding, no CVA tenderness, no tenderness at McBurney's point and negative Murphy's sign. Hernia confirmed negative in the right inguinal area and confirmed negative  in the left inguinal area.  Soft, NTND, +BS throughout, no r/g/r, neg murphy's, neg  mcburney's, no CVA TTP   Genitourinary: Testes normal and penis normal. Right testis shows no mass, no swelling and no tenderness. Left testis shows no mass, no swelling and no tenderness. Circumcised. No phimosis, paraphimosis, hypospadias, penile erythema or penile tenderness. No discharge found.  Chaperone present for exam Circumcised penis without phimosis/paraphimosis, hypospadias, erythema, tenderness, or discharge. Testes with no masses or tenderness, no swelling, and cremasterics reflex present bilaterally. No inguinal hernias or adenopathy present.   Musculoskeletal: Normal range of motion.  Neurological: He is alert and oriented to person, place, and time. He has normal strength. No sensory deficit.  Skin: Skin is warm, dry and intact. No rash noted.  Psychiatric: He has a normal mood and affect.  Nursing note and vitals reviewed.   ED Course  Procedures (including critical care time) Labs Review Labs Reviewed  RPR  HIV ANTIBODY (ROUTINE TESTING)  URINALYSIS, ROUTINE W REFLEX MICROSCOPIC (NOT AT [facility])  GC/CHLAMYDIA PROBE AMP ([place]) NOT AT [facility]    Imaging Review No results found. I have personally reviewed and evaluated these images and lab results as part of my medical decision-making.   EKG Interpretation None      MDM   Final diagnoses:  Penile discharge  Dysuria  Concern about STD in male without diagnosis    44 y.o. male here with dysuria and penile discharge x4 days, sexually active with 2 male partners, condom broke 2wks ago. Concerned for STDs. Will obtain STD check here and likely treat empirically. Will get U/A.   12:46 AM No penile discharge noted on exam, no testicular or penile tenderness. Will empirically treat for GC/CT. Will await U/A.   1:01 AM [facility] at shift change. Plan is to check U/A, as long as no other sources of dysuria exist then pt to be d/c'd home with instruction to f/up with health dept for future  STD concerns, use condoms for sexual activities, and await lab results for GC/CT/HIV/RPR to see if partners need testing/treatment. Please refer to Antony MaduraKelly Humes' note for further documentation of results and care.  BP 125/60 mmHg  Pulse 61  Temp(Src) 97.6 F (36.4 C) (Oral)  Resp 20  SpO2 99%  Meds ordered this encounter  Medications  . azithromycin (ZITHROMAX) tablet 1,000 mg    Sig:    And  . cefTRIAXone (ROCEPHIN) injection 250 mg    Sig:     Order Specific Question:  Antibiotic Indication:    Answer:  STD     Kastin Cerda Camprubi-Soms, PA-C 04/30/15 0102  Layla MawKristen N Ward, DO 04/30/15 0222

## 2015-05-01 LAB — HIV ANTIBODY (ROUTINE TESTING W REFLEX)

## 2015-05-01 LAB — HIV 1/2 AB DIFFERENTIATION
HIV 1 AB: POSITIVE — AB
HIV 2 AB: NEGATIVE

## 2015-08-26 DIAGNOSIS — A64 Unspecified sexually transmitted disease: Secondary | ICD-10-CM

## 2015-08-26 NOTE — ED Triage Notes (Signed)
PT arrived to ED c/o pain in his penis and burning when he urinates.

## 2015-08-27 ENCOUNTER — Inpatient Hospital Stay
Admit: 2015-08-27 | Discharge: 2015-08-27 | Disposition: A | Discharge status: Home / Self Care | Payer: MEDICARE | Attending: Emergency Medicine

## 2015-08-27 MED ORDER — CEFTRIAXONE 250 MG SOLUTION FOR INJECTION
250 mg | INTRAMUSCULAR | Status: DC
Start: 2015-08-27 — End: 2015-08-27
  Administered 2015-08-27: 06:00:00 via INTRAMUSCULAR

## 2015-08-27 MED ORDER — AZITHROMYCIN 1 GRAM ORAL PACKET
1 gram | ORAL | Status: AC
Start: 2015-08-27 — End: 2015-08-27
  Administered 2015-08-27: 06:00:00 via ORAL

## 2015-08-27 MED FILL — AZITHROMYCIN 1 GRAM ORAL PACKET: 1 gram | ORAL | Qty: 1

## 2015-08-27 MED FILL — CEFTRIAXONE 250 MG SOLUTION FOR INJECTION: 250 mg | INTRAMUSCULAR | Qty: 250

## 2015-08-27 NOTE — ED Provider Notes (Signed)
HPI Comments: 45 year old male complaining of pain with urination and green drainage from his penis.  Patient states that he had such we'll contact  3 weeks ago. The condom broke    Patient is a 45 y.o. male presenting with penile problem. The history is provided by the patient.   Penis Pain   This is a new problem. The current episode started more than 2 days ago. The problem occurs constantly. The problem has not changed since onset.Primary symptoms include dysuria and penile discharge.Pertinent negatives include no genital rash. The symptoms occur during urination. Pertinent negatives include no nausea, no vomiting and no abdominal pain.          No past medical history on file.    No past surgical history on file.      No family history on file.    Social History     Social History   ??? Marital status: SINGLE     Spouse name: N/A   ??? Number of children: N/A   ??? Years of education: N/A     Occupational History   ??? Not on file.     Social History Main Topics   ??? Smoking status: Not on file   ??? Smokeless tobacco: Not on file   ??? Alcohol use Not on file   ??? Drug use: Not on file   ??? Sexual activity: Not on file     Other Topics Concern   ??? Not on file     Social History Narrative         ALLERGIES: Sulfa (sulfonamide antibiotics)    Review of Systems   Constitutional: Negative.  Negative for activity change.   HENT: Negative.    Eyes: Negative.    Respiratory: Negative.    Cardiovascular: Negative.    Gastrointestinal: Negative.  Negative for abdominal pain, nausea and vomiting.   Genitourinary: Positive for dysuria and penile discharge.   Musculoskeletal: Negative.    Skin: Negative.    Neurological: Negative.    Psychiatric/Behavioral: Negative.    All other systems reviewed and are negative.      There were no vitals filed for this visit.         Physical Exam   Constitutional: He is oriented to person, place, and time. He appears well-developed and well-nourished. No distress.   HENT:    Head: Normocephalic and atraumatic.   Right Ear: External ear normal.   Left Ear: External ear normal.   Nose: Nose normal.   Mouth/Throat: Oropharynx is clear and moist. No oropharyngeal exudate.   Eyes: Conjunctivae and EOM are normal. Pupils are equal, round, and reactive to light. Right eye exhibits no discharge. Left eye exhibits no discharge. No scleral icterus.   Neck: Normal range of motion. Neck supple. No JVD present. No tracheal deviation present.   Cardiovascular: Normal rate, regular rhythm and intact distal pulses.    Pulmonary/Chest: Effort normal and breath sounds normal. No stridor. No respiratory distress.   Abdominal: Soft. He exhibits no distension.   Genitourinary: Right testis shows no swelling and no tenderness. Right testis is descended. Left testis shows no swelling and no tenderness. Left testis is descended. No phimosis, paraphimosis, penile erythema or penile tenderness. Discharge found.   Musculoskeletal: Normal range of motion. He exhibits no edema or tenderness.   Neurological: He is alert and oriented to person, place, and time. No cranial nerve deficit.   Skin: Skin is warm and dry. No rash noted. He is not diaphoretic.  No erythema. No pallor.   Psychiatric: He has a normal mood and affect. His behavior is normal. Thought content normal.   Nursing note and vitals reviewed.       MDM  Number of Diagnoses or Management Options  Diagnosis management comments: Assessment ST-T    Plan: Treatment in the ED for [facility].       Amount and/or Complexity of Data Reviewed  Clinical lab tests: ordered and reviewed    Risk of Complications, Morbidity, and/or Mortality  Presenting problems: low  Diagnostic procedures: low  Management options: low      ED Course       Procedures

## 2015-08-27 NOTE — ED Notes (Signed)
Pt left prior to receiving discharge instructions

## 2016-03-17 ENCOUNTER — Encounter (HOSPITAL_COMMUNITY): Payer: Self-pay | Admitting: Emergency Medicine

## 2016-03-17 ENCOUNTER — Emergency Department (HOSPITAL_COMMUNITY)
Admission: EM | Admit: 2016-03-17 | Discharge: 2016-03-17 | Disposition: A | Discharge status: Home / Self Care | Payer: Medicare (Managed Care) | Attending: Emergency Medicine | Admitting: Emergency Medicine

## 2016-03-17 DIAGNOSIS — M542 Cervicalgia: Secondary | ICD-10-CM

## 2016-03-17 DIAGNOSIS — I2510 Atherosclerotic heart disease of native coronary artery without angina pectoris: Secondary | ICD-10-CM | POA: Insufficient documentation

## 2016-03-17 MED ORDER — CYCLOBENZAPRINE HCL 5 MG PO TABS: 10.0000 mg | ORAL_TABLET | Freq: Three times a day (TID) | ORAL | 0 refills | Status: AC | PRN

## 2016-03-17 MED ORDER — CYCLOBENZAPRINE HCL 10 MG PO TABS
5.0000 mg | ORAL_TABLET | Freq: Once | ORAL | Status: AC
Start: 2016-03-17 — End: 2016-03-17
  Administered 2016-03-17: 5 mg via ORAL
  Filled 2016-03-17: qty 1

## 2016-03-17 MED ORDER — ACETAMINOPHEN 325 MG PO TABS
650.0000 mg | ORAL_TABLET | Freq: Once | ORAL | Status: AC
  Administered 2016-03-17: 650 mg via ORAL
  Filled 2016-03-17: qty 2

## 2016-03-17 NOTE — ED Provider Notes (Signed)
[facility]-EMERGENCY DEPT Provider Note   CSN: [id number] Arrival date & time: 03/17/16  0708     History   Chief Complaint Chief Complaint  Patient presents with  . Neck Pain    HPI Joshua Moss is a 45 y.o. male.  Joshua Moss is a 45 y.o. male with h/o crohn's, HIV, HLD, depression presents to ED from out of town with b/l neck pain. Patient reports he had the "best sleep of his life" approximately 2 days ago; however, throughout the day he noted left neck soreness. He tried taking ibuprofen with minimal relief. Symptoms progressed to now b/l neck soreness. He has tried warm compresses and aleve with minimal relief prompting him to come to the ED. He describes the pain as a soreness that is worse with movement and improved with rest. He denies fever, headache, changes in vision, photophobia, sore throat, trouble swallowing, chest pain, shortness of breath, numbness, weakness, dizziness, lightheadedness, abdominal pain, or vomiting. He denies any recent trauma; although endorses driving from [facility] yesterday.       History reviewed. No pertinent past medical history.  Patient Active Problem List   Diagnosis Date Noted  . ACUTE LARYNGITIS, WITHOUT MENTION OF OBSTRUCTIO 05/28/2009  . DEPRESSION 10/13/2008  . ERECTILE DYSFUNCTION 06/24/2008  . URETHRAL DISCHARGE 06/24/2008  . CROHN'S DISEASE 03/06/2008  . NONSPECIFIC ABNORMAL FINDING IN SEMEN 12/27/2007  . Unspecified otitis media 11/29/2007  . ACUTE BRONCHITIS 08/28/2007  . ANOREXIA 08/16/2007  . RASH AND OTHER NONSPECIFIC SKIN ERUPTION 05/15/2007  . ABSCESS, PERIRECTAL 05/03/2007  . MEMORY LOSS 05/03/2007  . HYPERLIPIDEMIA 03/22/2007  . 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 ASTHMA 03/22/2007  . SEIZURE DISORDER 03/22/2007  . Human immunodeficiency virus (HIV) disease (HCC) 02/26/2007    History reviewed. No pertinent surgical history.     Home Medications    Prior to Admission medications   Medication Sig  Start Date End Date Taking? Authorizing Provider  cyclobenzaprine (FLEXERIL) 5 MG tablet Take 2 tablets (10 mg total) by mouth 3 (three) times daily as needed for muscle spasms. 03/17/16   Lona Kettle, PA-C  oxyCODONE-acetaminophen (PERCOCET/ROXICET) 5-325 MG tablet Take 1 tablet by mouth every 6 (six) hours as needed for moderate pain.  04/28/15   Historical Provider, MD    Family History No family history on file.  Social History Social History  Substance Use Topics  . Smoking status: Never Smoker  . Smokeless tobacco: Not on file  . Alcohol use No     Allergies   Sulfonamide derivatives   Review of Systems Review of Systems  Constitutional: Negative for fever.  HENT: Negative for sore throat and trouble swallowing.   Eyes: Negative for photophobia and visual disturbance.  Respiratory: Negative for shortness of breath.   Cardiovascular: Negative for chest pain.  Gastrointestinal: Negative for abdominal pain, nausea and vomiting.  Musculoskeletal: Positive for neck pain.  Skin: Negative for rash.  Neurological: Negative for dizziness, weakness, light-headedness, numbness and headaches.     Physical Exam Updated Vital Signs BP 122/73 (BP Location: Right Arm)   Pulse 71   Temp 98 F (36.7 C) (Oral)   Resp 18   Ht 5\' 11"  (1.803 m)   Wt 86.2 kg   SpO2 100%   BMI 26.50 kg/m   Physical Exam  Constitutional: He appears well-developed and well-nourished. No distress.  HENT:  Head: Normocephalic and atraumatic.  Right Ear: Tympanic membrane, external ear and ear canal normal.  Left Ear: Tympanic membrane, external  ear and ear canal normal.  Mouth/Throat: Uvula is midline, oropharynx is clear and moist and mucous membranes are normal. No trismus in the jaw. No oropharyngeal exudate. No tonsillar exudate.  No trismus. Uvula midline.   Eyes: Conjunctivae and EOM are normal. Pupils are equal, round, and reactive to light. Right eye exhibits no discharge. Left eye  exhibits no discharge. No scleral icterus.  Neck: Normal range of motion and phonation normal. Neck supple. Muscular tenderness present. No spinous process tenderness present. No neck rigidity. Normal range of motion present.  Neck supple. No cervical lyphmadenopathy. No midline cervical tenderness. Mild TTP of trapezius b/l and SCM b/l. ROM intact although complains of soreness.   Cardiovascular: Normal rate, regular rhythm, normal heart sounds and intact distal pulses.   No murmur heard. Pulmonary/Chest: Effort normal and breath sounds normal. No stridor. No respiratory distress. He has no wheezes. He has no rales.  Abdominal: Soft. Bowel sounds are normal. He exhibits no distension. There is no tenderness. There is no rigidity and no CVA tenderness.  Musculoskeletal: Normal range of motion.  Lymphadenopathy:    He has no cervical adenopathy.  Neurological: He is alert. He has normal strength. He is not disoriented. No sensory deficit. Coordination and gait normal. GCS eye subscore is 4. GCS verbal subscore is 5. GCS motor subscore is 6.  Mental Status:  Alert, thought content appropriate, able to give a coherent history. Speech fluent without evidence of aphasia. Able to follow 2 step commands without difficulty.  Cranial Nerves:  II: pupils equal, round, reactive to light III,IV, VI: ptosis not present, extra-ocular motions intact bilaterally  V,VII: smile symmetric, facial light touch sensation equal VIII: hearing grossly normal to voice  IX, X: uvula elevates symmetrically  XI: bilateral shoulder shrug symmetric and strong XII: midline tongue extension without fassiculations  Skin: Skin is warm and dry. He is not diaphoretic.  Psychiatric: He has a normal mood and affect. His behavior is normal.     ED Treatments / Results  Labs (all labs ordered are listed, but only abnormal results are displayed) Labs Reviewed - No data to display  EKG  EKG Interpretation None        Radiology No results found.  Procedures Procedures (including critical care time)  Medications Ordered in ED Medications  acetaminophen (TYLENOL) tablet 650 mg (650 mg Oral Given 03/17/16 0809)  cyclobenzaprine (FLEXERIL) tablet 5 mg (5 mg Oral Given 03/17/16 0809)     Initial Impression / Assessment and Plan / ED Course  I have reviewed the triage vital signs and the nursing notes.  Pertinent labs & imaging results that were available during my care of the patient were reviewed by me and considered in my medical decision making (see chart for details).  Clinical Course    Patient presents to ED with complaint of b/l neck pain. Patient is afebrile and non-toxic appearing in NAD. VSS. No headache or photophobia. No trismus. Uvula midline. No stridor. No midline cervical spine tenderness. Mild TTP of b/l trapezius muscle and SCM. Neck ROM intact. No focal neuro deficits on exam. Based on Nexus criteria, do not feel imaging is warranted at this time. Low suspicion for deep space infection or meningitis. Suspect MSK in nature. Discussed symptomatic treatment to include heat/ice, tylenol/motrin, gentle stretching, and massage. Rx flexeril. [facility]. Return precautions provided. Patient voiced understanding and is agreeable.    Final Clinical Impressions(s) / ED Diagnoses  Final diagnoses:  Neck pain    New Prescriptions Discharge Medication List as of 03/17/2016  8:07 AM    START taking these medications   Details  cyclobenzaprine (FLEXERIL) 5 MG tablet Take 2 tablets (10 mg total) by mouth 3 (three) times daily as needed for muscle spasms., Starting Fri 03/17/2016, Print         Hickam [place], PA-C 03/17/16 1610    Nelva Nay, MD 03/18/16 [phone]

## 2016-03-17 NOTE — ED Triage Notes (Signed)
Patient states 2 days ago started having neck pain.  Patient states started on L neck and moved around to right side.   Patient states no other symptoms.  Patient states he did try ibuprofen, but no relief.

## 2016-03-17 NOTE — Discharge Instructions (Signed)
Read the information below.  You may muscle spasms. I encourage you to take tylenol 650mg  every 6hrs or motrin 400mg  every 6hrs.  I have prescribed flexeril, a muscle relaxer, for relief. Flexeril can make you drowsy, do not drive after taking.  You can apply heat or ice for 20 minute increments. Perform gentle stretching and massage.  Use the prescribed medication as directed.  Please discuss all new medications with your pharmacist.   Be sure to follow up with your primary care doctor [facility] if [facility] persist.  You may return to the Emergency Department at any time for worsening condition or any new symptoms that concern you. Return to ED if you develop fever, headache, changes in vision, numbness/weakness, chest pain, or shortness of breath.

## 2017-02-27 ENCOUNTER — Inpatient Hospital Stay
Admit: 2017-02-27 | Discharge: 2017-02-28 | Disposition: A | Discharge status: Home / Self Care | Payer: MEDICARE | Attending: Emergency Medicine

## 2017-02-27 ENCOUNTER — Emergency Department: Admit: 2017-02-27 | Payer: MEDICARE

## 2017-02-27 DIAGNOSIS — M25522 Pain in left elbow: Secondary | ICD-10-CM

## 2017-02-27 MED ORDER — IBUPROFEN 800 MG TAB
800 mg | Freq: Once | ORAL | Status: AC
Start: 2017-02-27 — End: 2017-02-27
  Administered 2017-02-27: via ORAL

## 2017-02-27 MED FILL — IBUPROFEN 800 MG TAB: 800 mg | ORAL | Qty: 1

## 2017-02-27 NOTE — ED Notes (Signed)
I have reviewed discharge instructions with the patient.  The patient verbalized understanding.    Patient left ED via Discharge Method: ambulatory to Home with family.    Opportunity for questions and clarification provided.       Patient given 1 scripts.         To continue your aftercare when you leave the hospital, you may receive an automated call from our care team to check in on how you are doing.  This is a free service and part of our promise to provide the best care and service to meet your aftercare needs.??? If you have questions, or wish to unsubscribe from this service please call [phone].  Thank you for Choosing our  Emergency Department.
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
## 2017-02-28 MED ORDER — NAPROXEN 500 MG TAB
500 mg | ORAL_TABLET | Freq: Two times a day (BID) | ORAL | 0 refills | Status: AC
Start: 2017-02-28 — End: 2017-03-09

## 2019-11-12 ENCOUNTER — Inpatient Hospital Stay
Admit: 2019-11-12 | Discharge: 2019-11-12 | Disposition: A | Discharge status: Home / Self Care | Payer: MEDICARE | Attending: Emergency Medicine

## 2019-11-12 DIAGNOSIS — N342 Other urethritis: Secondary | ICD-10-CM

## 2019-11-12 LAB — URINALYSIS W/ RFLX MICROSCOPIC
Bilirubin, Urine: NEGATIVE
Bilirubin: NEGATIVE
Blood, Urine: NEGATIVE
Blood: NEGATIVE
Glucose, Ur: NEGATIVE mg/dL
Glucose: NEGATIVE mg/dL
Leukocyte Esterase, Urine: NEGATIVE
Leukocyte Esterase: NEGATIVE
Nitrite, Urine: NEGATIVE
Nitrites: NEGATIVE
Protein, UA: NEGATIVE mg/dL
Protein: NEGATIVE mg/dL
Specific Gravity, UA: 1.028 — ABNORMAL HIGH (ref 1.001–1.023)
Specific gravity: 1.028 — ABNORMAL HIGH (ref 1.001–1.023)
Urobilinogen, UA, POCT: 0.2 EU/dL (ref 0.2–1.0)
Urobilinogen: 0.2 EU/dL (ref 0.2–1.0)
pH (UA): 5 (ref 5.0–9.0)
pH, UA: 5 (ref 5.0–9.0)

## 2019-11-12 MED ORDER — LIDOCAINE HCL 1 % (10 MG/ML) IJ SOLN
101 mg/mL (1 %) | Freq: Once | INTRAMUSCULAR | Status: AC
Start: 2019-11-12 — End: 2019-11-12
  Administered 2019-11-12: 13:00:00 via INTRAMUSCULAR

## 2019-11-12 MED ORDER — DOXYCYCLINE HYCLATE 100 MG TAB
100 mg | ORAL_TABLET | Freq: Two times a day (BID) | ORAL | 0 refills | Status: AC
Start: 2019-11-12 — End: 2019-11-22

## 2019-11-12 MED FILL — CEFTRIAXONE 500 MG SOLUTION FOR INJECTION: 500 mg | INTRAMUSCULAR | Qty: 500

## 2019-11-12 NOTE — ED Triage Notes (Signed)
Pt arrives via POV c/o discharge from penis and burning with urination x3 days. Sexual active and condom broke. Denies abd discomfort and fever/chills.

## 2019-11-12 NOTE — ED Notes (Signed)
I have reviewed discharge instructions with the patient.  The patient verbalized understanding.    Patient left ED via Discharge Method: ambulatory to Home with self    Opportunity for questions and clarification provided.       Patient given 1 scripts. No esign        To continue your aftercare when you leave the hospital, you may receive an automated call from our care team to check in on how you are doing.  This is a free service and part of our promise to provide the best care and service to meet your aftercare needs.??? If you have questions, or wish to unsubscribe from this service please call [phone].  Thank you for Choosing our [place] Emergency Department.

## 2019-11-12 NOTE — ED Provider Notes (Signed)
[facility] EMERGENCY DEPARTMENT     Philip Castaneda is a 49 y.o. male seen on 11/12/2019 in the [facility] EMERGENCY DEPT in room ERM/M.    Chief Complaint   Patient presents with   ??? Penile Discharge     HPI: 49 year old African-American male presented emergency department with complaints of discomfort with urinating, penile discharge and urinary frequency for the past 3 days.  Patient states that prior to symptoms occurring he was having sexual intercourse and was using a condom however the condom did break ultimately had unprotected sex.  He does not know if he was exposed to an STD but is concerned that he was.  He has never had an STD before.  He denies any abdominal pain, testicular pain, fevers, penile lesions or any other concerns.    Historian: Patient    REVIEW OF SYSTEMS     Review of Systems   Constitutional: Negative.    HENT: Negative.    Respiratory: Negative.    Cardiovascular: Negative.    Gastrointestinal: Negative.    Genitourinary: Positive for discharge, dysuria, frequency and urgency. Negative for penile swelling, scrotal swelling and testicular pain.   Musculoskeletal: Negative.    Skin: Negative.    Neurological: Negative.    Psychiatric/Behavioral: Negative.    All other systems reviewed and are negative.      PAST MEDICAL HISTORY     No past medical history on file.  No past surgical history on file.  Social History     Socioeconomic History   ??? Marital status: SINGLE     Spouse name: Not on file   ??? Number of children: Not on file   ??? Years of education: Not on file   ??? Highest education level: Not on file     Social Determinants of Health     Financial Resource Strain:    ??? Difficulty of Paying Living Expenses:    Food Insecurity:    ??? Worried About Programme researcher, broadcasting/film/video in the Last Year:    ??? Ran Out of Food in the Last Year:    Transportation Needs:    ??? Freight forwarder (Medical):    ??? Lack of Transportation (Non-Medical):    Physical Activity:    ??? Days of Exercise per  Week:    ??? Minutes of Exercise per Session:    Stress:    ??? Feeling of Stress :    Social Connections:    ??? Frequency of Communication with Friends and Family:    ??? Frequency of Social Gatherings with Friends and Family:    ??? Attends Religious Services:    ??? Database administrator or Organizations:    ??? Attends Banker Meetings:    ??? Marital Status:      None     Allergies   Allergen Reactions   ??? Sulfa (Sulfonamide Antibiotics) Rash        PHYSICAL EXAM       Vitals:    11/12/19 0738   BP: 123/88   Pulse: 67   Resp: 17   Temp: 98.5 ??F (36.9 ??C)   SpO2: 98%    Vital signs were reviewed.     Physical Exam  Vitals and nursing note reviewed.   Constitutional:       General: He is not in acute distress.     Appearance: Normal appearance. He is not ill-appearing or toxic-appearing.   HENT:      Head:  Atraumatic.      Mouth/Throat:      Mouth: Mucous membranes are moist.   Pulmonary:      Effort: Pulmonary effort is normal.   Abdominal:      Palpations: Abdomen is soft.      Tenderness: There is no abdominal tenderness. There is no guarding or rebound.   Genitourinary:     Penis: Normal.       Testes: Normal.   Musculoskeletal:         General: Normal range of motion.   Skin:     General: Skin is warm and dry.   Neurological:      General: No focal deficit present.      Mental Status: He is alert and oriented to person, place, and time.   Psychiatric:         Mood and Affect: Mood normal.         Behavior: Behavior normal.         Thought Content: Thought content normal.         Judgment: Judgment normal.          MEDICAL DECISION MAKING     ED Course:    Recent Results (from the past 8 hour(s))   URINALYSIS W/ RFLX MICROSCOPIC    Collection Time: 11/12/19  7:52 AM   Result Value Ref Range    Color YELLOW      Appearance CLOUDY      Specific gravity 1.028 (H) 1.001 - 1.023      pH (UA) 5.0 5.0 - 9.0      Protein Negative NEG mg/dL    Glucose Negative mg/dL    Ketone TRACE (A) NEG mg/dL    Bilirubin Negative  NEG      Blood Negative NEG      Urobilinogen 0.2 0.2 - 1.0 EU/dL    Nitrites Negative NEG      Leukocyte Esterase Negative NEG       No results found.      MDM  Number of Diagnoses or Management Options  Urethritis  Diagnosis management comments: 49-year-old male presented emergency department with dysuria and penile discharge for 3 days.   patient be treated empirically for STDs.  Return the emergency department for any concerns.       Amount and/or Complexity of Data Reviewed  Clinical lab tests: ordered and reviewed    Patient Progress  Patient progress: stable        Disposition:  Discharged  Diagnosis:     ICD-10-CM ICD-9-CM   1. Urethritis  N34.2 597.80     ____________________________________________________________________  A portion of this note was generated using voice recognition dictation software. While the note has been reviewed for accuracy, please note certain words and phrases may not be transcribed as intended and some grammatical and/or typographical errors may be present.

## 2019-11-12 NOTE — ED Notes (Signed)
Pt arrives via POV c/o discharge from penis and burning with urination x3 days. Sexual active and condom broke. Denies abd discomfort and fever/chills.

## 2019-11-12 NOTE — ED Provider Notes (Signed)
[facility] EMERGENCY DEPARTMENT     Philip Castaneda is a 49 y.o. male seen on 11/12/2019 in the [facility] EMERGENCY DEPT in room ERM/M.    Chief Complaint   Patient presents with   ??? Penile Discharge     HPI: 49 year old African-American male presented emergency department with complaints of discomfort with urinating, penile discharge and urinary frequency for the past 3 days.  Patient states that prior to symptoms occurring he was having sexual intercourse and was using a condom however the condom did break ultimately had unprotected sex.  He does not know if he was exposed to an STD but is concerned that he was.  He has never had an STD before.  He denies any abdominal pain, testicular pain, fevers, penile lesions or any other concerns.    Historian: Patient    REVIEW OF SYSTEMS     Review of Systems   Constitutional: Negative.    HENT: Negative.    Respiratory: Negative.    Cardiovascular: Negative.    Gastrointestinal: Negative.    Genitourinary: Positive for discharge, dysuria, frequency and urgency. Negative for penile swelling, scrotal swelling and testicular pain.   Musculoskeletal: Negative.    Skin: Negative.    Neurological: Negative.    Psychiatric/Behavioral: Negative.    All other systems reviewed and are negative.      PAST MEDICAL HISTORY     No past medical history on file.  No past surgical history on file.  Social History     Socioeconomic History   ??? Marital status: SINGLE     Spouse name: Not on file   ??? Number of children: Not on file   ??? Years of education: Not on file   ??? Highest education level: Not on file     Social Determinants of Health     Financial Resource Strain:    ??? Difficulty of Paying Living Expenses:    Food Insecurity:    ??? Worried About Programme researcher, broadcasting/film/video in the Last Year:    ??? Ran Out of Food in the Last Year:    Transportation Needs:    ??? Freight forwarder (Medical):    ??? Lack of Transportation (Non-Medical):    Physical Activity:    ??? Days of Exercise per  Week:    ??? Minutes of Exercise per Session:    Stress:    ??? Feeling of Stress :    Social Connections:    ??? Frequency of Communication with Friends and Family:    ??? Frequency of Social Gatherings with Friends and Family:    ??? Attends Religious Services:    ??? Database administrator or Organizations:    ??? Attends Banker Meetings:    ??? Marital Status:      None     Allergies   Allergen Reactions   ??? Sulfa (Sulfonamide Antibiotics) Rash        PHYSICAL EXAM       Vitals:    11/12/19 0738   BP: 123/88   Pulse: 67   Resp: 17   Temp: 98.5 ??F (36.9 ??C)   SpO2: 98%    Vital signs were reviewed.     Physical Exam  Vitals and nursing note reviewed.   Constitutional:       General: He is not in acute distress.     Appearance: Normal appearance. He is not ill-appearing or toxic-appearing.   HENT:      Head:  Atraumatic.      Mouth/Throat:      Mouth: Mucous membranes are moist.   Pulmonary:      Effort: Pulmonary effort is normal.   Abdominal:      Palpations: Abdomen is soft.      Tenderness: There is no abdominal tenderness. There is no guarding or rebound.   Genitourinary:     Penis: Normal.       Testes: Normal.   Musculoskeletal:         General: Normal range of motion.   Skin:     General: Skin is warm and dry.   Neurological:      General: No focal deficit present.      Mental Status: He is alert and oriented to person, place, and time.   Psychiatric:         Mood and Affect: Mood normal.         Behavior: Behavior normal.         Thought Content: Thought content normal.         Judgment: Judgment normal.          MEDICAL DECISION MAKING     ED Course:    Recent Results (from the past 8 hour(s))   URINALYSIS W/ RFLX MICROSCOPIC    Collection Time: 11/12/19  7:52 AM   Result Value Ref Range    Color YELLOW      Appearance CLOUDY      Specific gravity 1.028 (H) 1.001 - 1.023      pH (UA) 5.0 5.0 - 9.0      Protein Negative NEG mg/dL    Glucose Negative mg/dL    Ketone TRACE (A) NEG mg/dL    Bilirubin Negative  NEG      Blood Negative NEG      Urobilinogen 0.2 0.2 - 1.0 EU/dL    Nitrites Negative NEG      Leukocyte Esterase Negative NEG       No results found.      MDM  Number of Diagnoses or Management Options  Urethritis  Diagnosis management comments: 49 year old male presented emergency department with dysuria and penile discharge for 3 days.   patient be treated empirically for STDs.  Return the emergency department for any concerns.       Amount and/or Complexity of Data Reviewed  Clinical lab tests: ordered and reviewed    Patient Progress  Patient progress: stable        Disposition:  Discharged  Diagnosis:     ICD-10-CM ICD-9-CM   1. Urethritis  N34.2 597.80     ____________________________________________________________________  A portion of this note was generated using voice recognition dictation software. While the note has been reviewed for accuracy, please note certain words and phrases may not be transcribed as intended and some grammatical and/or typographical errors may be present.

## 2019-11-12 NOTE — ED Notes (Signed)
I have reviewed discharge instructions with the patient.  The patient verbalized understanding.    Patient left ED via Discharge Method: ambulatory to Home with self    Opportunity for questions and clarification provided.       Patient given 1 scripts. No esign        To continue your aftercare when you leave the hospital, you may receive an automated call from our care team to check in on how you are doing.  This is a free service and part of our promise to provide the best care and service to meet your aftercare needs." If you have questions, or wish to unsubscribe from this service please call [phone].  Thank you for Choosing our [facility] Emergency Department.

## 2019-11-14 LAB — CHLAMYDIA / GC-AMPLIFIED
CHLAMYDIA TRACHOMATIS, NAA, 188078: NEGATIVE
Chlamydia trachomatis, NAA: NEGATIVE
NEISSERIA GONORRHOEAE, NAA, 188086: NEGATIVE
Neisseria gonorrhoeae, NAA: NEGATIVE

## 2020-10-10 ENCOUNTER — Inpatient Hospital Stay
Admit: 2020-10-10 | Discharge: 2020-10-10 | Disposition: A | Discharge status: Home / Self Care | Payer: MEDICARE | Attending: Emergency Medicine

## 2020-10-10 DIAGNOSIS — A64 Unspecified sexually transmitted disease: Secondary | ICD-10-CM

## 2020-10-10 MED ORDER — DOXYCYCLINE HYCLATE 100 MG TAB
100 mg | ORAL_TABLET | Freq: Two times a day (BID) | ORAL | 0 refills | Status: AC
Start: 2020-10-10 — End: 2020-10-17

## 2020-10-10 MED ORDER — CEFTRIAXONE 500 MG SOLUTION FOR INJECTION
500 mg | Freq: Once | INTRAMUSCULAR | Status: AC
Start: 2020-10-10 — End: 2020-10-10
  Administered 2020-10-10: 13:00:00 via INTRAMUSCULAR

## 2020-10-10 MED ORDER — DOXYCYCLINE HYCLATE 100 MG CAP
100 mg | ORAL | Status: AC
Start: 2020-10-10 — End: 2020-10-10
  Administered 2020-10-10: 13:00:00 via ORAL

## 2020-10-10 MED FILL — DOXYCYCLINE HYCLATE 100 MG CAP: 100 mg | ORAL | Qty: 1

## 2020-10-10 MED FILL — CEFTRIAXONE 500 MG SOLUTION FOR INJECTION: 500 mg | INTRAMUSCULAR | Qty: 500

## 2020-10-10 NOTE — ED Provider Notes (Signed)
50 year old man complaining of penile pain and discharge.  He relates condom broke 3 weeks ago which may have exposed him to STD.  He is discharge and pain did not start till 3 days ago.      Penile Discharge  This is a new problem. The current episode started more than 2 days ago. The problem occurs constantly. The problem has not changed since onset.Primary symptoms include dysuria, penile discharge and penile pain. The symptoms occur during urination. Pertinent negatives include no anorexia and no nausea. There has been no fever.          Past Medical History:   Diagnosis Date   ??? Crohn disease (HCC)        History reviewed. No pertinent surgical history.      History reviewed. No pertinent family history.    Social History     Socioeconomic History   ??? Marital status: SINGLE     Spouse name: Not on file   ??? Number of children: Not on file   ??? Years of education: Not on file   ??? Highest education level: Not on file   Occupational History   ??? Not on file   Tobacco Use   ??? Smoking status: Never Smoker   ??? Smokeless tobacco: Never Used   Vaping Use   ??? Vaping Use: Never used   Substance and Sexual Activity   ??? Alcohol use: Never   ??? Drug use: Never   ??? Sexual activity: Yes     Partners: Male     Birth control/protection: Condom   Other Topics Concern   ??? Not on file   Social History Narrative   ??? Not on file     Social Determinants of Health     Financial Resource Strain:    ??? Difficulty of Paying Living Expenses: Not on file   Food Insecurity:    ??? Worried About Running Out of Food in the Last Year: Not on file   ??? Ran Out of Food in the Last Year: Not on file   Transportation Needs:    ??? Lack of Transportation (Medical): Not on file   ??? Lack of Transportation (Non-Medical): Not on file   Physical Activity:    ??? Days of Exercise per Week: Not on file   ??? Minutes of Exercise per Session: Not on file   Stress:    ??? Feeling of Stress : Not on file   Social Connections:    ??? Frequency of Communication with Friends and  Family: Not on file   ??? Frequency of Social Gatherings with Friends and Family: Not on file   ??? Attends Religious Services: Not on file   ??? Active Member of Clubs or Organizations: Not on file   ??? Attends Banker Meetings: Not on file   ??? Marital Status: Not on file   Intimate Partner Violence:    ??? Fear of Current or Ex-Partner: Not on file   ??? Emotionally Abused: Not on file   ??? Physically Abused: Not on file   ??? Sexually Abused: Not on file   Housing Stability:    ??? Unable to Pay for Housing in the Last Year: Not on file   ??? Number of Places Lived in the Last Year: Not on file   ??? Unstable Housing in the Last Year: Not on file         ALLERGIES: Sulfa (sulfonamide antibiotics)    Review of Systems  Constitutional: Negative.  Negative for activity change.   HENT: Negative.    Eyes: Negative.    Respiratory: Negative.    Cardiovascular: Negative.    Gastrointestinal: Negative.  Negative for anorexia and nausea.   Genitourinary: Positive for dysuria, penile discharge and penile pain.   Musculoskeletal: Negative.    Skin: Negative.    Neurological: Negative.    Psychiatric/Behavioral: Negative.    All other systems reviewed and are negative.      Vitals:    10/10/20 0701   BP: 123/79   Pulse: 65   Resp: 16   Temp: 97.3 ??F (36.3 ??C)   SpO2: 98%   Weight: 99.8 kg (220 lb)   Height: 5\' 11"  (1.803 m)            Physical Exam  Vitals and nursing note reviewed.   Constitutional:       General: He is not in acute distress.     Appearance: He is well-developed.   HENT:      Head: Normocephalic and atraumatic.      Right Ear: External ear normal.      Left Ear: External ear normal.      Nose: Nose normal.   Eyes:      General: No scleral icterus.        Right eye: No discharge.         Left eye: No discharge.      Conjunctiva/sclera: Conjunctivae normal.      Pupils: Pupils are equal, round, and reactive to light.   Cardiovascular:      Rate and Rhythm: Regular rhythm.   Pulmonary:      Effort: Pulmonary effort  is normal. No respiratory distress.      Breath sounds: Normal breath sounds. No stridor. No wheezing or rales.   Abdominal:      General: Bowel sounds are normal. There is no distension.      Palpations: Abdomen is soft.      Tenderness: There is no abdominal tenderness.   Musculoskeletal:         General: Normal range of motion.      Cervical back: Normal range of motion.   Skin:     General: Skin is warm and dry.      Findings: No rash.   Neurological:      Mental Status: He is alert and oriented to person, place, and time.      Motor: No abnormal muscle tone.      Coordination: Coordination normal.   Psychiatric:         Behavior: Behavior normal.          MDM  Number of Diagnoses or Management Options  Diagnosis management comments: STD complaint we will treat and send cultures patient to follow-up with health department           Amount and/or Complexity of Data Reviewed  Clinical lab tests: ordered and reviewed  Tests in the radiology section of CPT??: ordered and reviewed  Tests in the medicine section of CPT??: ordered and reviewed  Decide to obtain previous medical records or to obtain history from someone other than the patient: yes  Review and summarize past medical records: yes    Risk of Complications, Morbidity, and/or Mortality  Presenting problems: moderate  Diagnostic procedures: low  Management options: moderate           Procedures

## 2020-10-10 NOTE — ED Notes (Signed)
I have reviewed discharge instructions with the patient.  The patient verbalized understanding.    Patient left ED via Discharge Method: ambulatory to Home with self transport.      Opportunity for questions and clarification provided.       Patient given 0 scripts.         To continue your aftercare when you leave the hospital, you may receive an automated call from our care team to check in on how you are doing.  This is a free service and part of our promise to provide the best care and service to meet your aftercare needs." If you have questions, or wish to unsubscribe from this service please call [phone].  Thank you for Choosing our [facility] Emergency Department.

## 2020-10-10 NOTE — ED Notes (Signed)
Pt presents to ED for c/o penile discharge and discomfort with urination.  Pt reports possible condom break during intercourse approx 2-3 weeks PTA and symptoms began 3 days PTA.   Pt denies N/V/D, fever, chills, and abd pain.

## 2021-08-03 ENCOUNTER — Inpatient Hospital Stay
Admit: 2021-08-03 | Discharge: 2021-08-03 | Disposition: A | Discharge status: Home / Self Care | Payer: MEDICARE | Attending: Student in an Organized Health Care Education/Training Program

## 2021-08-03 ENCOUNTER — Emergency Department: Admit: 2021-08-03 | Payer: MEDICARE | Primary: Adult Health

## 2021-08-03 DIAGNOSIS — R0789 Other chest pain: Secondary | ICD-10-CM

## 2021-08-03 LAB — CBC WITH AUTO DIFFERENTIAL
Absolute Immature Granulocyte: 0 10*3/uL (ref 0.0–0.5)
Basophils %: 1 % (ref 0.0–2.0)
Basophils Absolute: 0 10*3/uL (ref 0.0–0.2)
Eosinophils %: 4 % (ref 0.5–7.8)
Eosinophils Absolute: 0.3 10*3/uL (ref 0.0–0.8)
Hematocrit: 44.6 % (ref 41.1–50.3)
Hemoglobin: 14.5 g/dL (ref 13.6–17.2)
Immature Granulocytes: 0 % (ref 0.0–5.0)
Lymphocytes %: 47 % — ABNORMAL HIGH (ref 13–44)
Lymphocytes Absolute: 3 10*3/uL (ref 0.5–4.6)
MCH: 29.3 PG (ref 26.1–32.9)
MCHC: 32.5 g/dL (ref 31.4–35.0)
MCV: 90.1 FL (ref 82–102)
MPV: 10.4 FL (ref 9.4–12.3)
Monocytes %: 7 % (ref 4.0–12.0)
Monocytes Absolute: 0.4 10*3/uL (ref 0.1–1.3)
Neutrophils %: 41 % — ABNORMAL LOW (ref 43–78)
Neutrophils Absolute: 2.6 10*3/uL (ref 1.7–8.2)
Platelets: 206 10*3/uL (ref 150–450)
RBC: 4.95 M/uL (ref 4.23–5.6)
RDW: 13.9 % (ref 11.9–14.6)
WBC: 6.3 10*3/uL (ref 4.3–11.1)
nRBC: 0 10*3/uL (ref 0.0–0.2)

## 2021-08-03 LAB — COMPREHENSIVE METABOLIC PANEL
ALT: 35 U/L (ref 12–65)
AST: 16 U/L (ref 15–37)
Albumin/Globulin Ratio: 1.1 (ref 0.4–1.6)
Albumin: 3.8 g/dL (ref 3.5–5.0)
Alk Phosphatase: 68 U/L (ref 50–136)
Anion Gap: 6 mmol/L (ref 2–11)
BUN: 13 MG/DL (ref 6–23)
CO2: 22 mmol/L (ref 21–32)
Calcium: 8.1 MG/DL — ABNORMAL LOW (ref 8.3–10.4)
Chloride: 112 mmol/L — ABNORMAL HIGH (ref 101–110)
Creatinine: 1.6 MG/DL — ABNORMAL HIGH (ref 0.8–1.5)
Est, Glom Filt Rate: 52 mL/min/{1.73_m2} — ABNORMAL LOW (ref >60)
Globulin: 3.5 g/dL (ref 2.8–4.5)
Glucose: 88 mg/dL (ref 65–100)
Potassium: 3.8 mmol/L (ref 3.5–5.1)
Sodium: 140 mmol/L (ref 133–143)
Total Bilirubin: 0.3 MG/DL (ref 0.2–1.1)
Total Protein: 7.3 g/dL (ref 6.3–8.2)

## 2021-08-03 LAB — EKG 12-LEAD
Atrial Rate: 80 {beats}/min
Atrial Rate: 55 {beats}/min
Diagnosis: NORMAL
P Axis: 58 degrees
P Axis: 37 degrees
P-R Interval: 164 ms
P-R Interval: 168 ms
Q-T Interval: 364 ms
Q-T Interval: 426 ms
QRS Duration: 86 ms
QRS Duration: 92 ms
QTc Calculation (Bazett): 419 ms
QTc Calculation (Bazett): 407 ms
R Axis: 68 degrees
R Axis: 58 degrees
T Axis: 50 degrees
T Axis: 44 degrees
Ventricular Rate: 80 {beats}/min
Ventricular Rate: 55 {beats}/min

## 2021-08-03 LAB — TROPONIN
Troponin, High Sensitivity: 4.8 pg/mL (ref 0–14)
Troponin, High Sensitivity: 4.1 pg/mL (ref 0–14)

## 2021-08-03 LAB — D-DIMER, QUANTITATIVE: D-Dimer, Quant: 0.37 ug/ml(FEU) (ref <0.56)

## 2021-08-03 LAB — LIPASE: Lipase: 113 U/L (ref 73–393)

## 2021-08-03 LAB — MAGNESIUM: Magnesium: 2.4 mg/dL (ref 1.8–2.4)

## 2021-08-03 MED ORDER — CYCLOBENZAPRINE HCL 10 MG PO TABS
10 MG | ORAL_TABLET | Freq: Three times a day (TID) | ORAL | 2 refills | Status: AC | PRN
Start: 2021-08-03 — End: 2021-08-13

## 2021-08-03 MED ORDER — KETOROLAC TROMETHAMINE 15 MG/ML IJ SOLN
15 MG/ML | Freq: Once | INTRAMUSCULAR | Status: AC
Start: 2021-08-03 — End: 2021-08-03
  Administered 2021-08-03: 20:00:00 15 mg via INTRAVENOUS

## 2021-08-03 MED ORDER — ASPIRIN 325 MG PO TABS
325 MG | ORAL | Status: AC
Start: 2021-08-03 — End: 2021-08-03
  Administered 2021-08-03: 20:00:00 325 mg via ORAL

## 2021-08-03 MED ORDER — IBUPROFEN 800 MG PO TABS
800 | ORAL_TABLET | Freq: Four times a day (QID) | ORAL | 0 refills | Status: DC | PRN
Start: 2021-08-03 — End: 2024-05-16

## 2021-08-03 MED ORDER — NITROGLYCERIN 0.4 MG SL SUBL
0.4 MG | SUBLINGUAL | Status: DC | PRN
Start: 2021-08-03 — End: 2021-08-03

## 2021-08-03 MED FILL — KETOROLAC TROMETHAMINE 15 MG/ML IJ SOLN: 15 mg/mL | INTRAMUSCULAR | Qty: 1 | Fill #0

## 2021-08-03 MED FILL — ASPIRIN 325 MG PO TABS: 325 mg | ORAL | Qty: 1 | Fill #0

## 2021-08-03 NOTE — ED Provider Notes (Signed)
Emergency Department Provider Note                   PCP:                No primary care provider on file.               Age: 51 y.o.      Sex: male     No diagnosis found.    DISPOSITION          Medical Decision Making  51 year old male patient presenting to this department with reports of constant chest pain for the past 3 days.  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  On established with cardiology locally.  Reassuring EKG on arrival  Orders placed for labs to include troponin, D-dimer and chest x-ray.    Amount and/or Complexity of Data Reviewed  Labs: ordered.  Radiology: ordered.  ECG/medicine tests: ordered.    Risk  OTC drugs.  Prescription drug management.                          ED Course as of 08/03/21 1359   Wed Aug 03, 2021   1357 EKG Interpretation: Sinus rhythm, rate of 80, normal axis, no ischemia.  Some prominence to the ST segment diffusely. [BR]      ED Course User Index  [BR] Lacie Draft, DO        Orders Placed This Encounter   Procedures    XR CHEST (2 VW)    CBC with Auto Differential    Comprehensive Metabolic Panel    Troponin    Troponin    Magnesium    Lipase    D-Dimer, Quantitative    EKG 12 Lead        Medications   aspirin tablet 325 mg (has no administration in time range)   ketorolac (TORADOL) injection 15 mg (has no administration in time range)       New Prescriptions    No medications on file        Philip Castaneda is a 51 y.o. male who presents to the Emergency Department with chief complaint of    Chief Complaint   Patient presents with    Chest Pain      51 year old male patient with history of previous MI, stent x1 presents to this department with reports of "severe chest pain".  Patient states symptoms started 3 days ago.  He reports constant pain in the left side of his chest that is nonradiating except for some discomfort in his back.  He denies obvious alleviating factors, he states pain is sometimes worsened with certain positions.   He denies shortness of breath cough or congestion reports no fever, chills, nausea or vomiting, diaphoresis.  Patient denies similar symptoms to this degree in the past but has had chest pain before.  He attempted nitro glycerin last evening without effect.  Has also tried Tylenol and Motrin.  He denies falls, trauma.  He reports no heavy lifting or changes in activity.  Patient recently relocated to the area and has not established with a local cardiology provider.    The history is provided by the patient. No language interpreter was used.       Review of Systems   Constitutional:  Negative for chills and fatigue.   HENT:  Negative for congestion.    Eyes:  Negative for visual disturbance.  Respiratory:  Negative for cough, chest tightness, shortness of breath and wheezing.    Cardiovascular:  Positive for chest pain. Negative for leg swelling.   Gastrointestinal:  Negative for abdominal distention, abdominal pain, constipation, nausea and vomiting.   Genitourinary:  Negative for difficulty urinating, dysuria, flank pain and hematuria.   Musculoskeletal:  Negative for back pain, neck pain and neck stiffness.   Skin:  Negative for color change.   Neurological:  Negative for dizziness, light-headedness, numbness and headaches.   All other systems reviewed and are negative.    Past Medical History:   Diagnosis Date    Crohn disease (HCC)         No past surgical history on file.     No family history on file.     Social History     Socioeconomic History    Marital status: Single   Tobacco Use    Smoking status: Never    Smokeless tobacco: Never   Substance and Sexual Activity    Alcohol use: Never    Drug use: Never         Sulfa antibiotics     Previous Medications    No medications on file        Vitals signs and nursing note reviewed.   Patient Vitals for the past 4 hrs:   Temp Pulse Resp BP SpO2   08/03/21 1328 97.5 ??F (36.4 ??C) 79 18 109/78 98 %          Physical Exam  Vitals and nursing note reviewed.    Constitutional:       General: He is not in acute distress.     Appearance: Normal appearance. He is normal weight. He is not ill-appearing or toxic-appearing.      Comments: Generally well-appearing, alert and oriented x4.  No acute distress, speaks in clear, fluid sentences.   HENT:      Head: Normocephalic and atraumatic.      Right Ear: External ear normal.      Left Ear: External ear normal.      Nose: Nose normal.      Mouth/Throat:      Mouth: Mucous membranes are moist.   Eyes:      General: No scleral icterus.        Right eye: No discharge.         Left eye: No discharge.      Extraocular Movements: Extraocular movements intact.   Cardiovascular:      Rate and Rhythm: Normal rate and regular rhythm.      Pulses: Normal pulses.      Heart sounds: Normal heart sounds.   Pulmonary:      Effort: Pulmonary effort is normal. No tachypnea, bradypnea, accessory muscle usage, prolonged expiration or respiratory distress.      Breath sounds: Normal breath sounds and air entry. No stridor. No decreased breath sounds, wheezing, rhonchi or rales.      Comments: Clear to auscultation throughout.  No focal findings.  Chest:      Comments: Patient reports some discomfort with auscultation via stethoscope at the chest.  There is no noted trauma.  Abdominal:      General: Abdomen is flat. There is no distension.      Palpations: There is no mass.      Tenderness: There is no abdominal tenderness. There is no right CVA tenderness, left CVA tenderness, guarding or rebound. Negative signs include Murphy's sign and McBurney's sign.  Hernia: No hernia is present.   Musculoskeletal:         General: No swelling, tenderness or deformity. Normal range of motion.      Cervical back: Normal range of motion.   Skin:     General: Skin is warm.      Capillary Refill: Capillary refill takes less than 2 seconds.   Neurological:      General: No focal deficit present.      Mental Status: He is alert.   Psychiatric:         Mood and  Affect: Mood normal.        Procedures    No results found for any visits on 08/03/21.     XR CHEST (2 VW)    (Results Pending)                       Voice dictation software was used during the making of this note.  This software is not perfect and grammatical and other typographical errors may be present.  This note has not been completely proofread for errors.        Philip Castaneda Philip Cofield, DO  08/03/21 1401

## 2021-08-03 NOTE — ED Notes (Signed)
I have reviewed discharge instructions with the patient.  The patient verbalized understanding.    Patient left ED via Discharge Method: ambulatory to Home with (Self).    Opportunity for questions and clarification provided.       Patient given 2 scripts.         To continue your aftercare when you leave the hospital, you may receive an automated call from our care team to check in on how you are doing.  This is a free service and part of our promise to provide the best care and service to meet your aftercare needs.??? If you have questions, or wish to unsubscribe from this service please call [phone].  Thank you for Choosing our [facility] Emergency Department.      Gevena Barre, RN  08/03/21 1710

## 2021-08-03 NOTE — ED Triage Notes (Signed)
Reports constant CP x3 days that radiates into his back. Took nitro last night with Significant heart hx

## 2021-08-03 NOTE — Discharge Instructions (Signed)
Your evaluation today reveals no evidence of acute heart abnormality.  However, you require close outpatient follow-up with a cardiology specialist. This appointment has been arranged and you should receive a call from this specialist within the next 48 hours to schedule your appointment.  If you do not receive this call, please call the number listed.  Return immediately if your symptoms worsen or you have any concerns or questions.  Patient has been prescribed to help with discomfort.  Use this medication as needed for ongoing pain.     We would love to help you get a primary care doctor for follow-up after your emergency department visit.    Please call [phone] between 7AM - 6PM Monday to Friday.  A care navigator will be able to assist you with setting up a doctor close to your home.

## 2021-08-04 NOTE — Telephone Encounter (Signed)
Attempted to call patient to schedule new patient appointment. Number unavailable.

## 2021-08-04 NOTE — Telephone Encounter (Signed)
-----   Message from Lacie Draft, DO sent at 08/03/2021  5:02 PM EST -----  Regarding: chest pain follow up  [place] with reported chest pain for 3 days.  Work-up in this department was stable.  Patient does have history of previous MI and stent placement.  He recently relocated to the area and has an established with cardiology.  Requesting outpatient follow-up in the next 1 to 2 weeks for recheck.

## 2021-09-02 ENCOUNTER — Inpatient Hospital Stay
Admit: 2021-09-02 | Discharge: 2021-09-02 | Disposition: A | Discharge status: Home / Self Care | Payer: MEDICARE | Attending: Emergency Medicine

## 2021-09-02 DIAGNOSIS — Z113 Encounter for screening for infections with a predominantly sexual mode of transmission: Secondary | ICD-10-CM

## 2021-09-02 DIAGNOSIS — Z202 Contact with and (suspected) exposure to infections with a predominantly sexual mode of transmission: Secondary | ICD-10-CM

## 2021-09-02 LAB — POCT URINALYSIS DIPSTICK
Bilirubin, Urine, POC: NEGATIVE
Blood, UA POC: NEGATIVE
Glucose, UA POC: NEGATIVE mg/dL
Ketones, Urine, POC: NEGATIVE mg/dL
Leukocyte Est, UA POC: NEGATIVE
Nitrite, Urine, POC: NEGATIVE
Protein, Urine, POC: NEGATIVE mg/dL
Specific Gravity, Urine, POC: 1.015 (ref 1.001–1.023)
URINE UROBILINOGEN POC: 0.2 EU/dL (ref 0.2–1.0)
pH, Urine, POC: 5.5 (ref 5.0–9.0)

## 2021-09-02 MED ORDER — CEFTRIAXONE SODIUM 500 MG IJ SOLR
500 MG | INTRAMUSCULAR | Status: DC
Start: 2021-09-02 — End: 2021-09-02

## 2021-09-02 MED ORDER — DOXYCYCLINE HYCLATE 100 MG PO TABS
100 MG | ORAL_TABLET | Freq: Two times a day (BID) | ORAL | 0 refills | Status: AC
Start: 2021-09-02 — End: 2021-09-09

## 2021-09-02 MED ORDER — CEFTRIAXONE SODIUM 500 MG IJ SOLR
500 MG | Freq: Once | INTRAMUSCULAR | Status: AC
Start: 2021-09-02 — End: 2021-09-02
  Administered 2021-09-02: 14:00:00 500 mg via INTRAMUSCULAR

## 2021-09-02 MED FILL — CEFTRIAXONE SODIUM 500 MG IJ SOLR: 500 MG | INTRAMUSCULAR | Qty: 500

## 2021-09-02 NOTE — Discharge Instructions (Signed)
You have been given a dose of antibiotics here in the emergency department today and I have prescribed you a 7-day course of doxycycline antibiotics.  Take the entire course of the medication.  This medication can make you sensitive to the sun so if you are outside on a sunny day wear long sleeves or make sure you are wearing sun protection.    We would love to help you get a primary care doctor for follow-up after your emergency department visit.    Please call [phone] between 7AM - 6PM Monday to Friday.  A care navigator will be able to assist you with setting up a doctor close to your home.

## 2021-09-02 NOTE — ED Triage Notes (Signed)
Pt reports three to four days ago he began to have yellowish green discharge from penis along with dysuria and is concerned for STI. Denies abdominal pain, fever, chills, n/v/d, or body aches.

## 2021-09-02 NOTE — ED Notes (Signed)
I have reviewed discharge instructions with the patient.  The patient verbalized understanding.    Patient left ED via Discharge Method: ambulatory to Home with (Self).    Opportunity for questions and clarification provided.       Patient given 1 scripts.         To continue your aftercare when you leave the hospital, you may receive an automated call from our care team to check in on how you are doing.  This is a free service and part of our promise to provide the best care and service to meet your aftercare needs.??? If you have questions, or wish to unsubscribe from this service please call [phone].  Thank you for Choosing our [facility] Emergency Department.        Gevena Barre, RN  09/02/21 1031

## 2021-09-02 NOTE — ED Provider Notes (Signed)
[facility] Emergency Department Provider Note                   PCP:                NOT ON FILE, MD               Age: 51 y.o.      Sex: male       ICD-10-CM    1. Screen for STD (sexually transmitted disease)  Z11.3       2. Penile discharge  R36.9           DISPOSITION           Orders Placed This Encounter   Procedures    C.trachomatis N.gonorrhoeae DNA, Urine    POCT Urine Dipstick    POCT Urinalysis no Micro        Yogi Arther is a 51 y.o. male who presents to the Emergency Department with chief complaint of    Chief Complaint   Patient presents with    Penile Discharge      51 year old male presents to the emergency department with chief complaint of concern over possible STD contraction.  He states that 3 days ago he began experiencing pain with urination and a yellowish-green thick penile discharge.  States he has had a new sexual partner in the last 2 weeks and was using protection however states the condom broke at that time of sexual relations.    The history is provided by the patient.       Review of Systems    Past Medical History:   Diagnosis Date    Crohn disease (HCC)         History reviewed. No pertinent surgical history.     History reviewed. No pertinent family history.     Social History     Socioeconomic History    Marital status: Single     Spouse name: None    Number of children: None    Years of education: None    Highest education level: None   Tobacco Use    Smoking status: Never    Smokeless tobacco: Never   Substance and Sexual Activity    Alcohol use: Never    Drug use: Never         Sulfa antibiotics     Previous Medications    IBUPROFEN (ADVIL;MOTRIN) 800 MG TABLET    Take 1 tablet by mouth every 6 hours as needed for Pain        Vitals signs and nursing note reviewed.   Patient Vitals for the past 4 hrs:   Temp Pulse Resp BP SpO2   09/02/21 0946 98.6 ??F (37 ??C) 66 17 121/87 99 %          Physical Exam  Constitutional:       General: He is not in acute distress.     Appearance:  Normal appearance. He is not ill-appearing.   HENT:      Head: Normocephalic and atraumatic.   Eyes:      Extraocular Movements: Extraocular movements intact.      Conjunctiva/sclera: Conjunctivae normal.      Pupils: Pupils are equal, round, and reactive to light.   Neurological:      Mental Status: He is alert.        MDM  Number of Diagnoses or Management Options  Penile discharge  Screen for STD (sexually transmitted disease)  Diagnosis management comments:  MEDICAL DECISION MAKING  Complexity of Problems Addressed:  1 acute uncomplicated illness or injury    Data Reviewed and Analyzed:  Category 1:     I ordered each unique test.  I interpreted the results of each unique test.        Category 2:   I independently ordered and interpreted the labs    Category 3: Discussion of management or test interpretation.  Decision to treat in the outpatient setting with p.o. doxycycline x7-day course for suspected STD chlamydia versus gonorrhea.  IM Rocephin administered in ED course.  Will assess urine dipstick and urine chlamydia/gonorrhea screening.    Risk of Complications and/or Morbidity of Patient Management:  Prescription drug management performed and Patient was discharged risks and benefits of hospitalization were considered         Amount and/or Complexity of Data Reviewed  Clinical lab tests: ordered and reviewed    Patient Progress  Patient progress: stable      Procedures      Labs Reviewed   C.TRACHOMATIS N.GONORRHOEAE DNA, URINE   POCT URINALYSIS DIPSTICK        No orders to display                          Voice dictation software was used during the making of this note.  This software is not perfect and grammatical and other typographical errors may be present.  This note has not been completely proofread for errors.     Frederik Schmidt, PA  09/02/21 1003

## 2021-09-05 LAB — C.TRACHOMATIS N.GONORRHOEAE DNA
Chlamydia trachomatis, NAA: NEGATIVE
N. Gonorrhoeae RNA: NEGATIVE

## 2021-11-01 ENCOUNTER — Inpatient Hospital Stay
Admit: 2021-11-01 | Discharge: 2021-11-01 | Disposition: A | Discharge status: Home / Self Care | Payer: MEDICARE | Attending: Emergency Medicine

## 2021-11-01 DIAGNOSIS — R369 Urethral discharge, unspecified: Secondary | ICD-10-CM

## 2021-11-01 LAB — URINALYSIS
Bilirubin Urine: NEGATIVE
Blood, Urine: NEGATIVE
Glucose, UA: NEGATIVE mg/dL
Leukocyte Esterase, Urine: NEGATIVE
Nitrite, Urine: NEGATIVE
Protein, UA: NEGATIVE mg/dL
Specific Gravity, UA: 1.022 (ref 1.001–1.023)
Urobilinogen, Urine: 0.2 EU/dL (ref 0.2–1.0)
pH, Urine: 5 (ref 5.0–9.0)

## 2021-11-01 LAB — POCT URINALYSIS DIPSTICK
Bilirubin, Urine, POC: NEGATIVE
Glucose, UA POC: NEGATIVE mg/dL
Ketones, Urine, POC: NEGATIVE mg/dL
Leukocyte Est, UA POC: NEGATIVE
Nitrite, Urine, POC: NEGATIVE
Protein, Urine, POC: NEGATIVE mg/dL
Specific Gravity, Urine, POC: >1.03 — ABNORMAL HIGH (ref 1.001–1.023)
URINE UROBILINOGEN POC: 0.2 EU/dL (ref 0.2–1.0)
pH, Urine, POC: 5 (ref 5.0–9.0)

## 2021-11-01 MED ORDER — LIDOCAINE HCL 1% INJ (MIXTURES ONLY)
1 % | INTRAMUSCULAR | Status: AC
Start: 2021-11-01 — End: 2021-11-01
  Administered 2021-11-01: 17:00:00 500 mg via INTRAMUSCULAR

## 2021-11-01 MED ORDER — DOXYCYCLINE HYCLATE 100 MG PO TABS
100 MG | ORAL_TABLET | Freq: Two times a day (BID) | ORAL | 0 refills | Status: AC
Start: 2021-11-01 — End: 2021-11-08

## 2021-11-01 MED FILL — CEFTRIAXONE SODIUM 500 MG IJ SOLR: 500 MG | INTRAMUSCULAR | Qty: 500

## 2021-11-01 NOTE — Discharge Instructions (Signed)
Take antibiotic as prescribed.    Schedule close follow-up with [place] [place] for further STI testing.    Return if symptoms worsen or progress in any way.

## 2021-11-01 NOTE — ED Notes (Signed)
I have reviewed discharge instructions with the patient.  The patient verbalized understanding.    Patient left ED via Discharge Method: ambulatory to Home with self    Opportunity for questions and clarification provided.       Patient given 1 scripts.         To continue your aftercare when you leave the hospital, you may receive an automated call from our care team to check in on how you are doing.  This is a free service and part of our promise to provide the best care and service to meet your aftercare needs." If you have questions, or wish to unsubscribe from this service please call [phone].  Thank you for Choosing our [facility] Emergency Department.        Huey Romans, RN  11/01/21 1310

## 2021-11-01 NOTE — ED Triage Notes (Signed)
Pt c/o soreness to bilateral groin x2wks w fatigue and penile discharge (-)lesions, rash (+)burning w urination  A&Ox4

## 2021-11-01 NOTE — ED Provider Notes (Signed)
Emergency Department Provider Note       PCP: NOT ON FILE, MD   Age: 51 y.o.   Sex: male     DISPOSITION Decision To Discharge 11/01/2021 01:00:01 PM       ICD-10-CM    1. Penile discharge  R36.9           Medical Decision Making     Complexity of Problems Addressed:  1 stable acute uncomplicated illness.    Data Reviewed and Analyzed:  Category 1:   I independently ordered and reviewed each unique test.  I reviewed external records: ED visit note from an outside group.  I reviewed external records: provider visit note from PCP.  I reviewed external records: provider visit note from outside specialist.  I reviewed external records: previous lab results from outside ED.  I reviewed external records: previous imaging study including radiologist interpretation.       Category 2:       Category 3: Discussion of management or test interpretation.  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  States he has been compliant with his ARTs.  Denies any genital ulcers or lesions.  Denies testicular pain or swelling.  Patient states he is noticed bilateral groin pain.   FROM of bilateral hips/lower extremities.  NVID.  No swelling or tenderness noted to hips.  No groin tenderness or swelling noted.  Patient given Rocephin 500 mg IM.  GC/chlamydia swab pending.  Will discharge home with doxycycline and instructions to [place]  Patient given return precautions.        Risk of Complications and/or Morbidity of Patient Management:  Prescription drug management performed.  Chronic medical problems impacting care include HIV.         History      Hasson Gaspard is a 51 y.o. male who presents to the Emergency Department with chief complaint of penile discharge.    Chief Complaint   Patient presents with    Groin Pain    Penile Discharge             51 year old  male with history of HIV, Crohn's disease presents with complaint of penile discharge has been present in the past several days he reports history of STI in the past.  States he has been compliant with his ARTs.  Denies any genital ulcers or lesions.  Denies testicular pain or swelling.  Patient states he is noticed bilateral groin pain.  States that he is sexually active with 1 sexual partner but uses protection on a regular basis.  Denies nausea, vomiting, fever, chills.  Denies pain with walking.  Denies any hip pain or swelling.     The history is provided by the patient. No language interpreter was used.      Review of Systems   Constitutional:  Negative for chills, diaphoresis, fatigue and fever.   HENT:  Negative for sore throat, trouble swallowing and voice change.    Respiratory:  Negative for cough and shortness of breath.    Cardiovascular:  Negative for chest pain and leg swelling.   Gastrointestinal:  Negative for abdominal pain, constipation, diarrhea, nausea and vomiting.   Genitourinary:  Positive for penile discharge. Negative for decreased urine volume, dysuria, flank pain, genital sores, penile swelling, scrotal swelling and testicular pain.   Musculoskeletal:  Negative for arthralgias, back  pain, gait problem, joint swelling, myalgias and neck stiffness.   Skin:  Negative for rash and wound.   Neurological:  Negative for dizziness, weakness, light-headedness and headaches.   Hematological:  Does not bruise/bleed easily.     Physical Exam     Vitals signs and nursing note reviewed:  Vitals:    11/01/21 1209 11/01/21 1210   BP: 130/87    Pulse: 85    Resp: 18    Temp: 98.2 F (36.8 C)    TempSrc: Oral    SpO2: 97%    Weight:  211 lb (95.7 kg)   Height:  5\' 11"  (1.803 m)      Physical Exam  Vitals and nursing note reviewed. Exam conducted with a chaperone present.   Constitutional:       Appearance: Normal appearance.   HENT:      Head: Normocephalic.      Nose: Nose normal.      Mouth/Throat:       Mouth: Mucous membranes are moist.      Comments: No thrush present.  Eyes:      Extraocular Movements: Extraocular movements intact.      Conjunctiva/sclera: Conjunctivae normal.      Pupils: Pupils are equal, round, and reactive to light.   Cardiovascular:      Rate and Rhythm: Normal rate.      Pulses: Normal pulses.      Heart sounds: Normal heart sounds.   Pulmonary:      Breath sounds: Normal breath sounds.   Abdominal:      Palpations: Abdomen is soft.      Tenderness: There is no abdominal tenderness. There is no right CVA tenderness, left CVA tenderness or guarding.      Comments: No inguinal hernia noted.  No inguinal lymphadenopathy noted.  Soft, nontender with no rebound or guarding.  No peritoneal signs.   Genitourinary:     Comments: RN present for exam.  No genital ulcers or lesions noted.  Musculoskeletal:         General: No swelling, tenderness or deformity. Normal range of motion.      Cervical back: Normal range of motion.      Right lower leg: No edema.      Left lower leg: No edema.      Comments: Full range of motion of bilateral hips, knees, ankles.  No overlying tenderness to palpation.  DP/PT pulses 2+.  Cap refill <2 sec. Normal sensory exam. NVID.    Skin:     General: Skin is warm.      Findings: No erythema or rash.   Neurological:      Mental Status: He is alert.        Procedures     Procedures    Orders Placed This Encounter   Procedures    C.trachomatis N.gonorrhoeae DNA    Urinalysis w rflx microscopic    POCT Urinalysis no Micro        Medications   cefTRIAXone (ROCEPHIN) 500 mg in lidocaine 1 % 1.4286 mL IM Injection (500 mg IntraMUSCular Given 11/01/21 1303)       Discharge Medication List as of 11/01/2021  1:08 PM        START taking these medications    Details   doxycycline hyclate (VIBRA-TABS) 100 MG tablet Take 1 tablet by mouth 2 times daily for 7 days, Disp-14 tablet, R-0Print  Past Medical History:   Diagnosis Date    Crohn disease (HCC)         No past  surgical history on file.     Social History     Socioeconomic History    Marital status: Single   Tobacco Use    Smoking status: Never    Smokeless tobacco: Never   Substance and Sexual Activity    Alcohol use: Never    Drug use: Never        Discharge Medication List as of 11/01/2021  1:08 PM        CONTINUE these medications which have NOT CHANGED    Details   ibuprofen (ADVIL;MOTRIN) 800 MG tablet Take 1 tablet by mouth every 6 hours as needed for Pain, Disp-20 tablet, R-0Print              Results for orders placed or performed during the hospital encounter of 11/01/21   Urinalysis w rflx microscopic   Result Value Ref Range    Color, UA YELLOW/STRAW      Appearance CLEAR      Specific Gravity, UA 1.022 1.001 - 1.023      pH, Urine 5.0 5.0 - 9.0      Protein, UA Negative NEG mg/dL    Glucose, UA Negative mg/dL    Ketones, Urine TRACE (A) NEG mg/dL    Bilirubin Urine Negative NEG      Blood, Urine Negative NEG      Urobilinogen, Urine 0.2 0.2 - 1.0 EU/dL    Nitrite, Urine Negative NEG      Leukocyte Esterase, Urine Negative NEG     POCT Urinalysis no Micro   Result Value Ref Range    Specific Gravity, Urine, POC >1.030 (H) 1.001 - 1.023    pH, Urine, POC 5.0 5.0 - 9.0      Protein, Urine, POC Negative NEG mg/dL    Glucose, UA POC Negative NEG mg/dL    Ketones, Urine, POC Negative NEG mg/dL    Bilirubin, Urine, POC Negative NEG      Blood, UA POC Trace Intact (A) NEG      URINE UROBILINOGEN POC 0.2 0.2 - 1.0 EU/dL    Nitrite, Urine, POC Negative NEG      Leukocyte Est, UA POC Negative NEG      Performed by: Marilynne Driversebecca Stroud         No orders to display                     Voice dictation software was used during the making of this note.  This software is not perfect and grammatical and other typographical errors may be present.  This note has not been completely proofread for errors.     Kohlton Gilpatrick Steger Sheral FlowFlowers Jr., MD  11/01/21 1352

## 2021-11-03 LAB — C.TRACHOMATIS N.GONORRHOEAE DNA
Chlamydia trachomatis, NAA: NEGATIVE
N. Gonorrhoeae RNA: NEGATIVE

## 2022-01-17 DIAGNOSIS — N342 Other urethritis: Secondary | ICD-10-CM

## 2022-01-17 NOTE — ED Provider Notes (Signed)
Emergency Department Provider Note       PCP: NOT ON FILE   Age: 51 y.o.   Sex: male     DISPOSITION Discharge - Pending Orders Complete 01/17/2022 11:54:44 PM       ICD-10-CM    1. Urethritis  N34.2           Medical Decision Making     Complexity of Problems Addressed:  Complexity of Problem: 1 acute, uncomplicated illness or injury.    Data Reviewed and Analyzed:  Category 1:   I independently ordered and reviewed each unique test.  I reviewed external records: ED visit note from an outside group.  I reviewed external records: provider visit note from PCP.  I reviewed external records: previous lab results from outside ED.       Category 2:       Category 3: Discussion of management or test interpretation.  Given Rocephin and doxycycline.  Again reinforced the importance of protected sex.       Risk of Complications and/or Morbidity of Patient Management:      History     Philip Castaneda is a 51 y.o. male who presents to the Emergency Department with chief complaint of    Chief Complaint   Patient presents with    Penile Discharge      51 year old male with history of HIV, undetectable viral load, and prior syphilis, with recurrent urethritis, presents with green penile discharge for 3 days and burning with urination since last night.  He had protected sex with a new partner 3 weeks ago, but states the condom broke.  He has been compliant with HIV medication.         Physical Exam     Vitals signs and nursing note reviewed.   Vitals:    01/17/22 2339   BP: 125/89   Pulse: 78   Resp: 18   Temp: 97.9 F (36.6 C)   TempSrc: Oral   SpO2: 98%   Weight: 215 lb (97.5 kg)   Height: 5\' 11"  (1.803 m)       Physical Exam  Vitals and nursing note reviewed.   Constitutional:       Appearance: Normal appearance.   HENT:      Head: Normocephalic and atraumatic.      Nose: Nose normal.      Mouth/Throat:      Mouth: Mucous membranes are moist.   Eyes:      Extraocular Movements: Extraocular movements intact.      Pupils:  Pupils are equal, round, and reactive to light.   Cardiovascular:      Rate and Rhythm: Normal rate and regular rhythm.   Pulmonary:      Effort: Pulmonary effort is normal. No respiratory distress.   Abdominal:      General: Abdomen is flat. There is no distension.   Musculoskeletal:         General: No deformity. Normal range of motion.      Cervical back: Normal range of motion and neck supple.   Skin:     General: Skin is warm and dry.   Neurological:      General: No focal deficit present.      Mental Status: He is alert. Mental status is at baseline.   Psychiatric:         Mood and Affect: Mood normal.        Procedures     Procedures     Orders Placed  This Encounter   Procedures    C.trachomatis N.gonorrhoeae DNA    Culture, Urine    POCT Urine Dipstick    POCT Urinalysis no Micro        Medications   cefTRIAXone (ROCEPHIN) 500 mg in sterile water 1.43 mL IM Injection (has no administration in time range)       New Prescriptions    DOXYCYCLINE HYCLATE (VIBRA-TABS) 100 MG TABLET    Take 1 tablet by mouth 2 times daily for 10 days        Past Medical History:   Diagnosis Date    Crohn disease (HCC)         History reviewed. No pertinent surgical history.     Results for orders placed or performed during the hospital encounter of 01/17/22   POCT Urinalysis no Micro   Result Value Ref Range    Specific Gravity, Urine, POC >1.030 (H) 1.001 - 1.023    pH, Urine, POC 5.0 5.0 - 9.0      Protein, Urine, POC Negative NEG mg/dL    Glucose, UA POC Negative NEG mg/dL    Ketones, Urine, POC Negative NEG mg/dL    Bilirubin, Urine, POC Negative NEG      Blood, UA POC Negative NEG      URINE UROBILINOGEN POC 0.2 0.2 - 1.0 EU/dL    Nitrite, Urine, POC Negative NEG      Leukocyte Est, UA POC Negative NEG      Performed by: Elspeth Cho         No orders to display         Voice dictation software was used during the making of this note.  This software is not perfect and grammatical and other typographical errors may be  present.  This note has not been completely proofread for errors.     Samella Parr, MD  01/17/22 2356

## 2022-01-17 NOTE — ED Triage Notes (Signed)
Pt arrives to ed ambulatory with c/o penile discharge x days. Pt was having sex 3 weeks ago and the condom broke. Denies fever or chills. Discharge is greenish and pt denies blood. Pt states dysuria today. Pt states this was a new partner.

## 2022-01-17 NOTE — Discharge Instructions (Signed)
Take all antibiotic.  Do not have sex until antibiotic is completed.  Always wear protection.  Return for worsening or concerning symptoms.

## 2022-01-18 ENCOUNTER — Inpatient Hospital Stay
Admit: 2022-01-18 | Discharge: 2022-01-18 | Disposition: A | Discharge status: Home / Self Care | Payer: MEDICARE | Attending: Emergency Medicine

## 2022-01-18 LAB — POCT URINALYSIS DIPSTICK
Bilirubin, Urine, POC: NEGATIVE
Blood, UA POC: NEGATIVE
Glucose, UA POC: NEGATIVE mg/dL
Ketones, Urine, POC: NEGATIVE mg/dL
Leukocyte Est, UA POC: NEGATIVE
Nitrite, Urine, POC: NEGATIVE
Protein, Urine, POC: NEGATIVE mg/dL
Specific Gravity, Urine, POC: >1.03 — ABNORMAL HIGH (ref 1.001–1.023)
URINE UROBILINOGEN POC: 0.2 EU/dL (ref 0.2–1.0)
pH, Urine, POC: 5 (ref 5.0–9.0)

## 2022-01-18 MED ORDER — DOXYCYCLINE HYCLATE 100 MG PO TABS
100 MG | ORAL_TABLET | Freq: Two times a day (BID) | ORAL | 0 refills | Status: AC
Start: 2022-01-18 — End: 2022-01-27

## 2022-01-18 MED ORDER — CEFTRIAXONE SODIUM 500 MG IJ SOLR
500 MG | INTRAMUSCULAR | Status: AC
Start: 2022-01-18 — End: 2022-01-18
  Administered 2022-01-18: 04:00:00 500 mg via INTRAMUSCULAR

## 2022-01-18 MED FILL — CEFTRIAXONE SODIUM 500 MG IJ SOLR: 500 MG | INTRAMUSCULAR | Qty: 500

## 2022-01-18 NOTE — Progress Notes (Signed)
ED pharmacist has attempted to contact Philip Castaneda on 8/6 and 8/7 regarding their recent culture results via the phone number on file. The patient has not returned calls. A letter has been sent to the address on file instructing patient to return call to ED.    Janae Sauce, PharmD.  Emergency Medicine Clinical Pharmacist

## 2022-01-18 NOTE — ED Notes (Signed)
I have reviewed discharge instructions with the patient.  The patient verbalized understanding.    Patient left ED via Discharge Method: ambulatory to Home with self.    Opportunity for questions and clarification provided.       Patient given 1 scripts.         To continue your aftercare when you leave the hospital, you may receive an automated call from our care team to check in on how you are doing.  This is a free service and part of our promise to provide the best care and service to meet your aftercare needs." If you have questions, or wish to unsubscribe from this service please call [phone].  Thank you for Choosing our [facility] Emergency Department.        Nadine Counts, LPN  [id number]/[phone]

## 2022-01-20 LAB — CULTURE, URINE: Culture: NO GROWTH

## 2022-01-22 LAB — C.TRACHOMATIS N.GONORRHOEAE DNA
Chlamydia trachomatis, NAA: POSITIVE — AB
N. Gonorrhoeae RNA: NEGATIVE

## 2022-02-10 NOTE — ED Provider Notes (Signed)
Formatting of this note is different from the original.  Emergency Department Provider Note    Patient Identification  Philip Castaneda is a 51 y.o. male    Chief Complaint   Penile Discharge (Pt complain of penile discharge for 3 days and burning when urination this morning)    HPI  Philip Castaneda is a 51 y.o. male with past medical history of HIV, Crohn's, CAD who  presents to the emergency room concerned of penile discharge for three days and dysuria since this morning. Patient reports he had sexual intercourse with a partner 3 weeks ago, and the condom ended up breaking. He is concerned about STI/STDs. He denies abdominal/flank pain, n/v, fever, chills,  testicular pain, edema, rash, rectal pain,  pain with defecation    Additional history from independent historians:  None    There is no problem list on file for this patient.    No past medical history on file.  No past surgical history on file.  No family history on file.    Current Outpatient Medications   Medication Instructions    doxycycline (ADOXA) 100 mg, oral, 2 times daily, Take with a full glass of water and do not lie down for at least 30 minutes after     Allergies   Allergen Reactions    Atorvastatin Rash     Memory problems .   Memory problems .      Sulfa (Sulfonamide Antibiotics) Rash     Social History     Socioeconomic History    Marital status: Single     Review of Systems  Per HPI    Physical Exam:  Patient Vitals for the past 24 hrs:   BP Temp Temp src Pulse Resp SpO2 Height Weight   02/10/22 1043 136/83 36.7 C (98.1 F) Oral 79 20 97 % 1.803 m (5\' 11" ) 99.8 kg (220 lb)     GENERAL: well developed, sitting comfortably, non toxic appearing  SKIN: warm, moist, no rash  HEAD: normocephalic, atraumatic  EYES: sclera anicteric & w/o injection, PERRL, EOMI  MOUTH: patent, moist mucous membranes  NECK: supple, activeROM w/o pain   RESPIRATORY: effort nonlabored, breath sounds clear to auscultation bilaterally  CARDIOVASCULAR: RRR, radial pulses 2+  symmetric bilaterally  ABDOMEN: soft, no focal TTP, no distention  GU:  no CVA tenderness, genital declined  MUSCULOSKELETAL: good activeROM of all extremities, no lower extremity edema   NEURO: A&Ox3, normal speech, normal gait, symmetric smile     DIAGNOSTICS  I ordered and reviewed:  Labs Reviewed   URINALYSIS (UA) WITH REFLEX TO CULTURE IF ABNORMAL WBC - Abnormal       Result Value    Color Yellow      Clarity Hazy (*)     Specific Gravity Urine 1.023      pH Urine Random 5.0      Protein Urine Qualitative Negative      Glucose Urine Qualitative Negative      Ketone Urine Qualitative Negative      Blood Urine Qualitative Negative      Ascorbic Acid Urine Qualitative Negative      Urobilinogen Urine Qualitative <2.0      Nitrite Urine Qualitative Negative      Leukocyte Esterase Urine Qual Negative      Bilirubin Urine Qualitative Negative      WBC/HPF 1      RBC/HPF 1      Mucus/LPF 1+       No orders to  display     I independently interpreted:  An ECG was not performed on this patient   My independent interpretation: N/A     MEDICAL DECISION MAKING  Philip Castaneda is a 51 y.o. male who presents to the emergency department complaining of dysuria and penile discharge after condom broke while having intercourse.     Records Reviewed: I reviewed the patient record remarkable for prior ED visit on 01/10/22.     DDX included but not limited to: gonorrhea, chlamydia, trichomonas,  epididymitis, syphilis, urinary tract infection, pyelonephritis, prostatitis    Vitals signs were grossly normal per my interpretation. Physical exam unremarkable. UA without significant pyuria or hematuria. I have low suspicion for epididymitis as patient has no testicular swelling, pain or fever. Syphilis less likely as patient denies genital lesion or maculopapular rash. Pyelonephritis less likely as patient has no systemic symptoms or CVA tenderness on exam. Prostatitis less likely as patient has no systemic symptoms or rectal pressure or  pain with defecation. Pt presentation is currently most consistent with STD exposure. Given report of penile discharge and dysuria after condom broke, I did offer STD treatment. Patient did wish to move forward with treatment.  Patient was empirically treated for STIS with 500mg  IM ceftriaxone for gonorrhea, 1g Azithromycin for Chlamydia, and 2g Flagyl for Trichomoniasis.     Diagnoses as of 02/10/22 1917   Penile discharge   Dysuria     Treatments:   Medications   cefTRIAXone (Rocephin) 500 mg in lidocaine (Xylocaine) 10 mg/mL (1 %) IM syringe (500 mg intramuscular Given 02/10/22 1325)   metroNIDAZOLE (Flagyl) tablet 2,000 mg (2,000 mg oral Given 02/10/22 1325)   azithromycin (Zithromax) tablet 1,000 mg (1,000 mg oral Given 02/10/22 1325)     Procedures: None     Plan/Disposition:   Patient discharged home with stable vitals and Rx  of doxycycline to cover for possible resistance to azithromycin.  Patient informed to abstain from sex while undergoing treatment. I also advised the patient that they should inform any sexual partners to be treated. Pt advised to f/u with Health Department or [facility] clinic for a full panel of STD testing. Patient was advised to return to the ED for new or worsening symptoms.        [facility], Ermalinda Barrios  02/10/22 1932    Electronically signed by 02/12/22, MD at 03/02/2022  7:12 PM EDT

## 2022-03-03 ENCOUNTER — Inpatient Hospital Stay
Admit: 2022-03-03 | Discharge: 2022-03-03 | Disposition: A | Discharge status: Home / Self Care | Payer: MEDICARE | Attending: Emergency Medicine

## 2022-03-03 DIAGNOSIS — J069 Acute upper respiratory infection, unspecified: Secondary | ICD-10-CM

## 2022-03-03 LAB — COVID-19, RAPID: SARS-CoV-2, Rapid: NOT DETECTED

## 2022-03-03 MED ORDER — AZITHROMYCIN 500 MG PO TABS
500 MG | ORAL_TABLET | Freq: Every day | ORAL | 0 refills | Status: AC
Start: 2022-03-03 — End: 2022-03-08

## 2022-03-03 NOTE — ED Triage Notes (Signed)
Patient arrives ambulatory to triage stating that he was exposed to Covid-19. Patient is asking for a test at this time. Patient denies symptoms at this time. AxO x4.

## 2022-03-03 NOTE — ED Provider Notes (Signed)
Emergency Department Provider Note                   PCP:                NOT ON FILE               Age: 51 y.o.      Sex: male   Final diagnosis/impression:  1. Viral URI    2. Acute recurrent maxillary sinusitis       Disposition: Discharged    MDM/Clinical Course:  Patient seen by myself at the [facility] emergency department. Patient had signs symptoms and clinical history most consistent with viral upper respiratory infection type symptoms in the context of COVID exposure, suspect developing sinusitis given history of recurrent sinusitis, increasing congestion, sinus pain, pressure. My independent analysis/interpretation of laboratory work-up here shows COVID testing is negative.  Recommended continued supportive care measures including Mucinex use, ibuprofen/Tylenol use as needed, outpatient prescription for azithromycin 500 mg x 5 days written.  Recommended p.o. fluid hydration, close monitoring of symptoms, return as needed.  Patient/family given instructions to return as needed for any questions, concerns or worsening symptoms, particularly those as outlined in the disposition section / discharge section of patient discharge paperwork. Patient/family verbalizes understanding and agreement with ED course/plan in shared medical decision making. Questions answered.        Complexity of Problems Addressed:  Acute problem with uncertain diagnosis/prognosis    Data Reviewed and Analyzed:  Category 1:   I ordered each unique test.  I reviewed the results of each unique test.    Category 2:     Category 3: Discussion of management or test interpretation.  See MDM / clinical course section above for details    Risk of Complications and/or Morbidity of Patient Management:  Prescription drug management performed.  Shared medical decision making was utilized in creating the patients health plan today.     Orders Placed This Encounter   Procedures    COVID-19, Rapid      ED Meds Given:  Medications - No data  to display  New Prescriptions    AZITHROMYCIN (ZITHROMAX) 500 MG TABLET    Take 1 tablet by mouth daily for 5 days         ___________________  HPI: Philip Castaneda is a 51 y.o. male who denies significant past medical history presenting for evaluation of upper respiratory type symptoms and request for COVID test.  Patient recently on a cruise, started with congestion symptoms starting last Friday, return from the cruise on Saturday.  Patient has been informed by several others who are on the cruise that he was in contact with that they have tested positive for COVID.  Patient denies any significant severe cough, denies shortness of breath, near syncope, denies nausea vomiting or diarrhea we will symptoms, states generally he feels well other than sinus congestion, pressure that is worsening despite medical management including Mucinex use and other over-the-counter medications.  Patient notes he has a history of recurrent sinusitis and feels like this is turning into a sinusitis. Patient/family denies any other evaluation for today's acute complaint. Patient/family denies any other aggravating or alleviating factors. Patient/family denies any other symptoms.    ROS:   All review of systems negative except as noted above in the history of the present illness.    Past Medical/ Family/ Social History:     Medical history:   Past Medical History:   Diagnosis Date  Crohn disease (HCC)      Surgical history: No past surgical history on file.  Family history: No family history on file.  Social history:   Social History     Socioeconomic History    Marital status: Single   Tobacco Use    Smoking status: Never    Smokeless tobacco: Never   Substance and Sexual Activity    Alcohol use: Never    Drug use: Never     Medications:   Previous Medications    IBUPROFEN (ADVIL;MOTRIN) 800 MG TABLET    Take 1 tablet by mouth every 6 hours as needed for Pain      Allergies:   Allergies   Allergen Reactions    Atorvastatin Other (See  Comments)     Memory problems .   Memory problems .       Sulfa Antibiotics Rash       Physical Exam   Vitals signs reviewed.   Patient Vitals for the past 4 hrs:   Temp Pulse Resp BP SpO2   03/03/22 0651 98.2 F (36.8 C) 79 16 (!) 142/97 98 %     General: Alert and oriented 4, no acute distress   Eyes: Anicteric, conjunctiva pink, PERRLA, EOMI  ENT: There is nasal congestion present, no rhinorrhea  Pulmonary: symmetric chest rise, no increased work of breathing, no accessory muscle use  Cardiovascular: Regular rate and rhythm, no rub or gallop appreciated on my exam  GI: Abdomen is soft, nontender, nondistended  Musculoskeletal: No obvious joint deformity or joint effusion, normal joint range of motion  Neuro: Cranial nerves II through VII grossly intact, strength and sensation is grossly intact in the upper and lower extremities bilaterally  Skin: Skin is warm and dry    Procedures  Results for orders placed or performed during the hospital encounter of 03/03/22   COVID-19, Rapid    Specimen: Nasopharyngeal   Result Value Ref Range    Source NASAL      SARS-CoV-2, Rapid Not detected NOTD        No orders to display                   Voice dictation software was used during the making of this note.  This software is not perfect and grammatical and other typographical errors may be present.  This note has not been completely proofread for errors.     Ann Maki, MD  03/03/22 [phone]

## 2022-03-03 NOTE — ED Notes (Signed)
I have reviewed discharge instructions with the patient.  The patient verbalized understanding.    Patient left ED via Discharge Method: ambulatory to Home with self.    Opportunity for questions and clarification provided.       Patient given 1 scripts.         To continue your aftercare when you leave the hospital, you may receive an automated call from our care team to check in on how you are doing.  This is a free service and part of our promise to provide the best care and service to meet your aftercare needs." If you have questions, or wish to unsubscribe from this service please call [phone].  Thank you for Choosing our [facility] Emergency Department.        Roselyn Meier, LPN  91/63/84 [id number]

## 2022-03-03 NOTE — Discharge Instructions (Addendum)
Return as needed for questions, concerns or worsening symptoms.  Your COVID testing today was negative.

## 2022-03-21 NOTE — ED Provider Notes (Signed)
Formatting of this note is different from the original.  Patient Identification  Philip Castaneda is a 51 y.o. male    Chief Complaint   Foot Pain (Pt complain of foot pain)    HPI  51 y.o. male presents to ER for bilateral dorsal foot pain ongoing for the past 6 months.  Patient states that symptoms "come and go all the time ".  States that some days he feels no pain at all, some days pain is unilateral, and other days he feels pain in both.  Today he states that pain is worse in the left foot versus the right.  Patient states pain is localized to area of bony prominence of bilateral dorsal feet that has been present since childhood.  He denies any injury or trauma, fevers, chills, decreased range of motion.  States it is painful to wear shoe, but that when he is wearing his slippers he feels fine.    Additional history from independent historians:  None    There is no problem list on file for this patient.    No past medical history on file.  No past surgical history on file.  No family history on file.    Current Outpatient Medications   Medication Instructions    ibuprofen (IBU) 800 mg, oral, Every 6 hours    lidocaine 4 % gel 1 Application., topical (top), 3 times daily     Allergies   Allergen Reactions    Atorvastatin Rash     Memory problems .   Memory problems .      Sulfa (Sulfonamide Antibiotics) Rash         Review of Systems  10 point ROS negative other than mentioned in HPI    Physical Exam:  Patient Vitals for the past 24 hrs:   BP Temp Temp src Pulse Resp SpO2 Height Weight   03/21/22 1754 119/76 -- -- -- -- -- -- --   03/21/22 1703 -- (!) 36.4 C (97.6 F) Oral 76 18 96 % 1.803 m (5\' 11" ) 99.8 kg (220 lb)     Oxygen Saturation Interpretation: Normal. No hypoxia.      GENERAL: well developed, sitting comfortably, non toxic appearing  SKIN: warm, moist  HEAD: normocephalic, atraumatic  EYES: sclera anicteric & w/o injection, PERRL, EOMI  MOUTH: patent, moist mucous membranes  NECK: supple, active ROM  w/o pain   RESPIRATORY: effort nonlabored, breath sounds clear to auscultation bilaterally  CARDIOVASCULAR: RRR  ABDOMEN: No distention  NEURO: A&Ox3, normal speech, symmetric smile    MUSCULOSKELETAL: good active ROM of all extremities.  Bilateral lower extremity no pallor noted. 2+ DP and radial pulses bilaterally. Soft compartments. Sensation intact bilaterally. Capillary refill <3 seconds. Thompson test negative.  Full range of motion to bilateral feet.  No tophi noted.  Area of bony prominence over dorsum of right and left foot noted.    DIAGNOSTICS  I ordered and reviewed:  Labs Reviewed - No data to display  No orders to display     An ECG was not performed on this patient. My independent interpretation: N/A     MEDICAL DECISION MAKING  ED Course  Bilateral foot pain that is chronic    DDX included but not limited to:   DVT, compartment syndrome, septic joint, osteomyelitis, gout, achilles tendon rupture, fracture vs dislocation, sprain vs strain     DVT considered, however patient low likelihood per Well's Criteria.   Compartments soft therefore I have a low suspicion for  compartment syndrome.   Pt afebrile, joint is not held in extension or flexion, and no overlying warmth or erythema to joint, therefore septic arthritis vs osteomyelitis considered less clinically likely at this time.   I have low suspicion for gout given absence of history of gout diagnosis or any history of preceding food or alcohol triggers.   Thompson test negative, therefore low suspicion for a Achilles tendon rupture.   Pt has full ROM therefore fracture v dislocation considered less clinically likely.  X-rays were considered but ultimately deferred.    Emergent etiology considered less clinically likely at this time.  I discussed with patient the importance of wearing shoes with wide toe boxes.  Given Toradol for pain. On re-evaluation and upon discharge, patient appears comfortable and in no acute distress. Vitals remain stable.  Patient advised to follow up with podiatry for further evaluation and long-term mgmt of current sxs. Patient expresses verbal understanding of plan and is amenable to discharge home.      Other Medical Conditions Impacting Care: N/A    Consideration of Social Determinants of Health and Impact on MDM: None    Records Reviewed: I reviewed the patient record remarkable for NA    Diagnoses as of 03/21/22 1846   Chronic foot pain, unspecified laterality     Treatments:   Medications   ketorolac (Toradol) injection 30 mg (has no administration in time range)     Disposition: Home    Patient advised to return to the ED for new or worsening symptoms. They were given the opportunity to ask questions which were reasonably addressed to the best of my ability.      Earlie Server [place], [place]  03/21/22 1934    Electronically signed by Alen Bleacher, MD at 03/22/2022  1:29 AM EDT

## 2022-03-21 NOTE — ED Triage Notes (Signed)
Formatting of this note might be different from the original.  ED PROVIDER MSE NOTE  Time to Provider: 5:02 PM  There were no vitals filed for this visit.    MSE: Bilateral foot pain 10/10 left worse for the past couple years but got worse 2 days ago.  Denies any recent injury/history of gout    Physical Exam:  General: well developed, alert  Respiratory: normal respiratory rate, non-labored breathing    YES, [facility]. I have seen the patient and initiated a Medical Screening Exam. The patient will require further evaluation to ensure that a medical condition does not exist or that one has been stabilized. If medically necessary, appropriate orders have been placed to initiate patient work up.    APP: Suszanne Finch, PA    Electronically signed by Suszanne Finch, PA at 03/21/2022  5:05 PM EDT

## 2022-04-02 ENCOUNTER — Inpatient Hospital Stay
Admit: 2022-04-02 | Discharge: 2022-04-03 | Disposition: A | Discharge status: Home / Self Care | Payer: MEDICARE | Attending: Emergency Medicine

## 2022-04-02 DIAGNOSIS — B349 Viral infection, unspecified: Secondary | ICD-10-CM

## 2022-04-02 MED ORDER — IBUPROFEN 400 MG PO TABS
400 MG | ORAL | Status: AC
Start: 2022-04-02 — End: 2022-04-02
  Administered 2022-04-02: 23:00:00 400 mg via ORAL

## 2022-04-02 MED FILL — IBUPROFEN 400 MG PO TABS: 400 MG | ORAL | Qty: 1

## 2022-04-02 NOTE — Discharge Instructions (Signed)
Please return with any vomiting, difficulty breathing, worsening symptoms, or additional concerns.    Follow-up with a primary care doctor for reevaluation.

## 2022-04-02 NOTE — ED Triage Notes (Signed)
Pt arrives to ER, recent traveling out of country ([place]). Reports generalized body aches, hot flashes and sweating, chills (did not check temp) - took tylenol with improvement.     Currently reports persistent HA, eye soreness, and severe body aches.

## 2022-04-02 NOTE — ED Notes (Signed)
I have reviewed discharge instructions with the patient.  The patient verbalized understanding.    Patient left ED via Discharge Method: ambulatory to Home with self.     Opportunity for questions and clarification provided.       Patient given 1 scripts.         To continue your aftercare when you leave the hospital, you may receive an automated call from our care team to check in on how you are doing.  This is a free service and part of our promise to provide the best care and service to meet your aftercare needs." If you have questions, or wish to unsubscribe from this service please call [phone].  Thank you for Choosing our [facility] Emergency Department.        Lalla Brothers, RN  04/02/22 2018

## 2022-04-02 NOTE — ED Provider Notes (Signed)
Emergency Department Provider Note       PCP: File, Not On   Age: 51 y.o.   Sex: male     DISPOSITION Decision To Discharge 04/02/2022 08:12:44 PM       ICD-10-CM    1. Viral illness  B34.9           Medical Decision Making     Patient likely has a viral illness.  I will check flu and COVID swabs.    Complexity of Problems Addressed:  Complexity of Problem: 1 acute, uncomplicated illness or injury.    Data Reviewed and Analyzed:  I independently ordered and reviewed each unique test.             Discussion of management or test interpretation.  Swabs are negative.  I do not think he has anything urgent or emergent and I will discharge him home.  I will send him out with a prescription for some Tessalon Perles for cough.       Risk of Complications and/or Morbidity of Patient Management:      History      51 year old gentleman presents with concerns about congestion, runny nose, cough, body aches, and in general not feeling well.  Patient says that he recently returned from [place] and his symptoms started there.  He says that been present for about 4 days    He denies any vomiting or diarrhea.    No other associated symptoms.    Elements of this note were created using speech recognition software.  As such, errors of speech recognition may be present.         Physical Exam     Vitals signs and nursing note reviewed.   Vitals:    04/02/22 1743 04/02/22 1747   BP: 126/88    Pulse: 83    Resp: 18    Temp: 98.4 F (36.9 C)    SpO2: 100%    Weight:  220 lb (99.8 kg)       Physical Exam  HENT:      Mouth/Throat:      Mouth: Mucous membranes are moist.   Eyes:      General:         Right eye: No discharge.         Left eye: No discharge.      Pupils: Pupils are equal, round, and reactive to light.   Cardiovascular:      Rate and Rhythm: Normal rate and regular rhythm.   Pulmonary:      Effort: Pulmonary effort is normal.      Breath sounds: Normal breath sounds.   Neurological:      General: No focal deficit  present.      Mental Status: He is alert and oriented to person, place, and time.          Procedures     Procedures     Orders Placed This Encounter   Procedures    Influenza A/B, Molecular    COVID-19, Rapid        Medications given during this emergency department visit:  Medications   ibuprofen (ADVIL;MOTRIN) tablet 400 mg (400 mg Oral Given 04/02/22 1926)       New Prescriptions    BENZONATATE (TESSALON) 100 MG CAPSULE    Take 2 capsules by mouth 3 times daily as needed for Cough        Past Medical History:   Diagnosis Date    Crohn disease (HCC)  No past surgical history on file.     Results for orders placed or performed during the hospital encounter of 04/02/22   Influenza A/B, Molecular    Specimen: Not Specified   Result Value Ref Range    Influenza A, NAA Not detected NOTD      Influenza B, NAA Not detected NOTD     COVID-19, Rapid    Specimen: Nasopharyngeal   Result Value Ref Range    Source NASAL      SARS-CoV-2, Rapid Not detected NOTD          No orders to display         Voice dictation software was used during the making of this note.  This software is not perfect and grammatical and other typographical errors may be present.  This note has not been completely proofread for errors.        Emeline Darling, MD  04/02/22 2015

## 2022-04-03 LAB — COVID-19, RAPID: SARS-CoV-2, Rapid: NOT DETECTED

## 2022-04-03 LAB — INFLUENZA A/B, MOLECULAR
Influenza A, NAA: NOT DETECTED
Influenza B, NAA: NOT DETECTED

## 2022-04-03 MED ORDER — BENZONATATE 100 MG PO CAPS
100 MG | ORAL_CAPSULE | Freq: Three times a day (TID) | ORAL | 0 refills | Status: AC | PRN
Start: 2022-04-03 — End: 2022-04-07

## 2022-10-13 ENCOUNTER — Emergency Department: Admit: 2022-10-13 | Payer: MEDICARE | Primary: Adult Health

## 2022-10-13 ENCOUNTER — Inpatient Hospital Stay: Admit: 2022-10-13 | Discharge: 2022-10-13 | Disposition: A | Discharge status: Home / Self Care | Payer: MEDICARE

## 2022-10-13 DIAGNOSIS — R369 Urethral discharge, unspecified: Secondary | ICD-10-CM

## 2022-10-13 LAB — POCT URINALYSIS DIPSTICK
Bilirubin, Urine, POC: NEGATIVE
Blood, UA POC: NEGATIVE
Glucose, UA POC: NEGATIVE mg/dL
Ketones, Urine, POC: NEGATIVE mg/dL
Leukocyte Est, UA POC: NEGATIVE
Nitrite, Urine, POC: NEGATIVE
Protein, Urine, POC: NEGATIVE mg/dL
Specific Gravity, Urine, POC: 1.02 (ref 1.001–1.023)
URINE UROBILINOGEN POC: 0.2 EU/dL (ref 0.2–1.0)
pH, Urine, POC: 5.5 (ref 5.0–9.0)

## 2022-10-13 LAB — COMPREHENSIVE METABOLIC PANEL W/ REFLEX TO MG FOR LOW K
ALT: 17 U/L (ref 12–65)
AST: 22 U/L (ref 15–37)
Albumin/Globulin Ratio: 1.3 (ref 1.0–1.9)
Albumin: 3.9 g/dL (ref 3.5–5.0)
Alk Phosphatase: 65 U/L (ref 40–129)
Anion Gap: 8 mmol/L — ABNORMAL LOW (ref 9–18)
BUN: 13 MG/DL (ref 6–23)
CO2: 22 mmol/L (ref 20–28)
Calcium: 8.9 MG/DL (ref 8.8–10.2)
Chloride: 106 mmol/L (ref 98–107)
Creatinine: 1.62 MG/DL — ABNORMAL HIGH (ref 0.80–1.30)
Est, Glom Filt Rate: 50 mL/min/{1.73_m2} — ABNORMAL LOW (ref >60)
Globulin: 3.1 g/dL (ref 2.3–3.5)
Glucose: 130 mg/dL — ABNORMAL HIGH (ref 70–99)
Potassium: 3.8 mmol/L (ref 3.5–5.1)
Sodium: 136 mmol/L (ref 136–145)
Total Bilirubin: 0.3 MG/DL (ref 0.0–1.2)
Total Protein: 7 g/dL (ref 6.3–8.2)

## 2022-10-13 LAB — CBC WITH AUTO DIFFERENTIAL
Basophils %: 1 % (ref 0.0–2.0)
Basophils Absolute: 0.1 10*3/uL (ref 0.0–0.2)
Eosinophils %: 4 % (ref 0.5–7.8)
Eosinophils Absolute: 0.2 10*3/uL (ref 0.0–0.8)
Hematocrit: 43.7 % (ref 41.1–50.3)
Hemoglobin: 14.4 g/dL (ref 13.6–17.2)
Immature Granulocytes %: 0 % (ref 0.0–5.0)
Immature Granulocytes Absolute: 0 10*3/uL (ref 0.0–0.5)
Lymphocytes %: 42 % (ref 13–44)
Lymphocytes Absolute: 2.4 10*3/uL (ref 0.5–4.6)
MCH: 29.8 PG (ref 26.1–32.9)
MCHC: 33 g/dL (ref 31.4–35.0)
MCV: 90.5 FL (ref 82–102)
MPV: 10.2 FL (ref 9.4–12.3)
Monocytes %: 5 % (ref 4.0–12.0)
Monocytes Absolute: 0.3 10*3/uL (ref 0.1–1.3)
Neutrophils %: 48 % (ref 43–78)
Neutrophils Absolute: 2.7 10*3/uL (ref 1.7–8.2)
Platelets: 156 10*3/uL (ref 150–450)
RBC: 4.83 M/uL (ref 4.23–5.6)
RDW: 13.4 % (ref 11.9–14.6)
WBC: 5.7 10*3/uL (ref 4.3–11.1)
nRBC: 0 10*3/uL (ref 0.0–0.2)

## 2022-10-13 LAB — LIPASE: Lipase: 39 U/L (ref 13–60)

## 2022-10-13 MED ORDER — DOXYCYCLINE HYCLATE 100 MG PO TABS
100 | ORAL_TABLET | Freq: Two times a day (BID) | ORAL | 0 refills | Status: AC
Start: 2022-10-13 — End: 2022-10-20

## 2022-10-13 MED ORDER — DICLOFENAC SODIUM 75 MG PO TBEC
75 | ORAL_TABLET | Freq: Two times a day (BID) | ORAL | 0 refills | 30.00000 days | Status: DC
Start: 2022-10-13 — End: 2024-05-16

## 2022-10-13 MED ORDER — LIDOCAINE HCL 1% INJ (MIXTURES ONLY)
1 | Freq: Once | INTRAMUSCULAR | Status: AC
Start: 2022-10-13 — End: 2022-10-13
  Administered 2022-10-13: 14:00:00 500 mg via INTRAMUSCULAR

## 2022-10-13 MED FILL — CEFTRIAXONE SODIUM 500 MG IJ SOLR: 500 MG | INTRAMUSCULAR | Qty: 500

## 2022-10-13 NOTE — ED Provider Notes (Signed)
Emergency Department Provider Note       PCP: File, Not On, MD   Age: 52 y.o.   Sex: male     DISPOSITION Discharge - Pending Orders Complete 10/13/2022 10:03:09 AM       ICD-10-CM    1. Penile discharge  R36.9       2. Right elbow pain  M25.521 [facility]          Medical Decision Making     Well-appearing 52 year old male presents to the emergency department today with complaint of right elbow pain and penile discharge.  He appears in no acute distress.  Exam of the right upper extremity today is unremarkable.  He states that his elbow typically only bothers him when he goes to pick things up.  Normal muscle tone noted.  Neurovascularly intact right upper extremity.  Imaging is negative for acute process.  Conservative treatments discussed with NSAID, stretching exercises, and compression.  Will refer to orthopedics and have encouraged him to follow-up with them if symptoms or not improving with conservative treatment.  Patient concern for STI today.  Will send urine for testing.  Will give IM Rocephin in the emergency department and discharged with doxycycline.  Encouraged to abstain from sexual activity for at least 10 days.  Encouraged to notify any sexual partners should test be positive today.     1 acute, uncomplicated illness or injury.  Prescription drug management performed.  Shared medical decision making was utilized in creating the patients health plan today.    I independently ordered and reviewed each unique test.       I interpreted the X-rays no acute fracture.              History     52 year old male presents to the emergency department today with complaint of penile discharge, dysuria, and right elbow pain.  Patient states that he initially signed in with complaint of abdominal pain but he does not actually have any abdominal pain.  He states that he did not want everyone in the lobby knowing his business.  He states he has had yellow penile discharge for the  last 3 or 4 days.  He does report a new sexual partner 3 to 4 weeks ago.  He denies any abdominal pain, pelvic pain, fever, nausea, or vomiting.  He also reports he has had right elbow pain for several months.  He states it does not typically bother him unless he is going to pick something up.  The pain is on the medial aspect of the elbow.  The pain does not radiate.  He denies any falls, trauma, or known injuries.    The history is provided by the patient.       ROS     Review of Systems   Constitutional:  Negative for fever.   Respiratory:  Negative for shortness of breath.    Cardiovascular:  Negative for chest pain.   Gastrointestinal:  Negative for abdominal pain, nausea and vomiting.   Genitourinary:  Positive for dysuria and penile discharge.   All other systems reviewed and are negative.       Physical Exam     Vitals signs and nursing note reviewed:  Vitals:    10/13/22 0809 10/13/22 1029   BP: 131/88 117/85   Pulse: 80 69   Resp: 16 15   Temp: 97.4 F (36.3 C) 97.8 F (36.6 C)   TempSrc:  Oral   SpO2:  98% 100%   Weight:  99.8 kg (220 lb)   Height:  1.803 m (5\' 11" )      Physical Exam  Vitals and nursing note reviewed.   Constitutional:       General: He is not in acute distress.     Appearance: Normal appearance. He is not ill-appearing, toxic-appearing or diaphoretic.   HENT:      Head: Normocephalic and atraumatic.   Eyes:      Extraocular Movements: Extraocular movements intact.      Conjunctiva/sclera: Conjunctivae normal.   Cardiovascular:      Rate and Rhythm: Normal rate.      Pulses:           Radial pulses are 2+ on the right side.   Pulmonary:      Effort: Pulmonary effort is normal. No respiratory distress.   Abdominal:      General: Abdomen is flat.      Tenderness: There is no abdominal tenderness.   Musculoskeletal:      Right upper arm: Normal.      Right elbow: Normal.      Right forearm: Normal.      Right wrist: Normal.      Cervical back: Normal range of motion.   Skin:     General:  Skin is warm and dry.      Capillary Refill: Capillary refill takes less than 2 seconds.   Neurological:      General: No focal deficit present.      Mental Status: He is alert and oriented to person, place, and time.        Procedures     Procedures    Orders Placed This Encounter   Procedures    C.trachomatis N.gonorrhoeae DNA    XR ELBOW RIGHT (MIN 3 VIEWS)    CBC with Auto Differential    Comprehensive Metabolic Panel w/ Reflex to MG    Lipase    [facility]    POCT Urine Dipstick    POCT Urinalysis no Micro        Medications given during this emergency department visit:  Medications   cefTRIAXone (ROCEPHIN) 500 mg in lidocaine 1 % 1.43 mL IM Injection (500 mg IntraMUSCular Given 10/13/22 1002)       New Prescriptions    DICLOFENAC (VOLTAREN) 75 MG EC TABLET    Take 1 tablet by mouth 2 times daily    DOXYCYCLINE HYCLATE (VIBRA-TABS) 100 MG TABLET    Take 1 tablet by mouth 2 times daily for 7 days        Past Medical History:   Diagnosis Date    Crohn disease (HCC)         No past surgical history on file.     Social History     Socioeconomic History    Marital status: Single   Tobacco Use    Smoking status: Never    Smokeless tobacco: Never   Substance and Sexual Activity    Alcohol use: Never    Drug use: Never        Previous Medications    IBUPROFEN (ADVIL;MOTRIN) 800 MG TABLET    Take 1 tablet by mouth every 6 hours as needed for Pain        Results for orders placed or performed during the hospital encounter of 10/13/22   XR ELBOW RIGHT (MIN 3 VIEWS)    Narrative    Right Elbow  INDICATION: Trauma    COMPARISON:  None    TECHNIQUE: 3 views of the right elbow were obtained.    FINDINGS: There is no evidence of fracture or dislocation. No bony abnormality  is seen. There is no joint effusion.      Impression    Negative right elbow     CBC with Auto Differential   Result Value Ref Range    WBC 5.7 4.3 - 11.1 K/uL    RBC 4.83 4.23 - 5.6 M/uL    Hemoglobin 14.4 13.6 - 17.2 g/dL     Hematocrit 10.2 72.5 - 50.3 %    MCV 90.5 82 - 102 FL    MCH 29.8 26.1 - 32.9 PG    MCHC 33.0 31.4 - 35.0 g/dL    RDW 36.6 44.0 - 34.7 %    Platelets 156 150 - 450 K/uL    MPV 10.2 9.4 - 12.3 FL    nRBC 0.00 0.0 - 0.2 K/uL    Differential Type AUTOMATED      Neutrophils % 48 43 - 78 %    Lymphocytes % 42 13 - 44 %    Monocytes % 5 4.0 - 12.0 %    Eosinophils % 4 0.5 - 7.8 %    Basophils % 1 0.0 - 2.0 %    Immature Granulocytes % 0 0.0 - 5.0 %    Neutrophils Absolute 2.7 1.7 - 8.2 K/UL    Lymphocytes Absolute 2.4 0.5 - 4.6 K/UL    Monocytes Absolute 0.3 0.1 - 1.3 K/UL    Eosinophils Absolute 0.2 0.0 - 0.8 K/UL    Basophils Absolute 0.1 0.0 - 0.2 K/UL    Immature Granulocytes Absolute 0.0 0.0 - 0.5 K/UL   Comprehensive Metabolic Panel w/ Reflex to MG   Result Value Ref Range    Sodium 136 136 - 145 mmol/L    Potassium 3.8 3.5 - 5.1 mmol/L    Chloride 106 98 - 107 mmol/L    CO2 22 20 - 28 mmol/L    Anion Gap 8 (L) 9 - 18 mmol/L    Glucose 130 (H) 70 - 99 mg/dL    BUN 13 6 - 23 MG/DL    Creatinine 4.25 (H) 0.80 - 1.30 MG/DL    Est, Glom Filt Rate 50 (L) >60 ml/min/1.26m2    Calcium 8.9 8.8 - 10.2 MG/DL    Total Bilirubin 0.3 0.0 - 1.2 MG/DL    ALT 17 12 - 65 U/L    AST 22 15 - 37 U/L    Alk Phosphatase 65 40 - 129 U/L    Total Protein 7.0 6.3 - 8.2 g/dL    Albumin 3.9 3.5 - 5.0 g/dL    Globulin 3.1 2.3 - 3.5 g/dL    Albumin/Globulin Ratio 1.3 1.0 - 1.9     Lipase   Result Value Ref Range    Lipase 39 13 - 60 U/L         XR ELBOW RIGHT (MIN 3 VIEWS)   Final Result   Negative right elbow                      No results for input(s): "COVID19" in the last 72 hours.    Voice dictation software was used during the making of this note.  This software is not perfect and grammatical and other typographical errors may be present.  This note has not been completely proofread for errors.  Lucille Passy, APRN - [place]  10/13/22 1031

## 2022-10-13 NOTE — Discharge Instructions (Signed)
The x-ray of your elbow is normal.  As we discussed, your symptoms are concerning for tendinitis.  Take medication as prescribed.  Perform stretching exercises frequently.  Compression with an Ace wrap or compression sleeve can be helpful.  If symptoms or not improving, follow-up with orthopedics.  Return to the emergency department for any new, worsening, or concerning symptoms.    As we discussed, the gonorrhea and Chlamydia test can take 3 to 5 days to result.  Take doxycycline as prescribed.  Abstain from sexual activity for at least 10 days.  Check your MyChart account for results.  We will call you with any positive results.  We do not call for negative results.

## 2022-10-13 NOTE — ED Triage Notes (Addendum)
Pt arrives to the ER with c/o abdominal pain x 4 days. Pt denies n/v/d. Pt states developing difficulty urinating. Pt states having  R. arm pain x uknown amount of months. Pt denies any injuries sustained to the extremity.

## 2022-10-13 NOTE — ED Notes (Signed)
Patient mobility status  with no difficulty. Provider aware     I have reviewed discharge instructions with the patient.  The patient verbalized understanding.    Patient left ED via Discharge Method: ambulatory to Home with  self .    Opportunity for questions and clarification provided.     Patient given 2 scripts.            D[place]st [place], [place]  10/13/22 1031

## 2022-10-17 LAB — C.TRACHOMATIS N.GONORRHOEAE DNA
Chlamydia trachomatis, NAA: NEGATIVE
N. Gonorrhoeae RNA: NEGATIVE

## 2024-05-16 ENCOUNTER — Inpatient Hospital Stay
Admit: 2024-05-16 | Discharge: 2024-05-16 | Disposition: A | Discharge status: Home / Self Care | Payer: Medicare (Managed Care) | Arrived: VH | Attending: Emergency Medicine

## 2024-05-16 ENCOUNTER — Emergency Department: Admit: 2024-05-16 | Payer: Medicare (Managed Care) | Primary: Adult Health

## 2024-05-16 DIAGNOSIS — M19012 Primary osteoarthritis, left shoulder: Secondary | ICD-10-CM

## 2024-05-16 MED ORDER — NAPROXEN 375 MG PO TABS
375 | ORAL_TABLET | Freq: Two times a day (BID) | ORAL | 0 refills | 30.00000 days | Status: AC
Start: 2024-05-16 — End: 2024-05-31

## 2024-05-16 MED ORDER — DEXAMETHASONE SODIUM PHOSPHATE 10 MG/ML IJ SOLN
10 | INTRAMUSCULAR | Status: AC
Start: 2024-05-16 — End: 2024-05-16
  Administered 2024-05-16: 20:00:00 10 mg via INTRAMUSCULAR

## 2024-05-16 MED ORDER — DICLOFENAC SODIUM 1 % EX GEL
1 | Freq: Four times a day (QID) | CUTANEOUS | 0 refills | 25.00000 days | Status: AC
Start: 2024-05-16 — End: ?

## 2024-05-16 MED FILL — DEXAMETHASONE SODIUM PHOSPHATE 10 MG/ML IJ SOLN: 10 mg/mL | INTRAMUSCULAR | Qty: 1 | Fill #0

## 2024-05-16 NOTE — ED Provider Notes (Signed)
 "   Emergency Department Provider Note       [facility] EMERGENCY DEPT   PCP: File, Not On   Age: 53 y.o.   Sex: male     DISPOSITION    No diagnosis found.    Medical Decision Making     Here with some right ear discomfort with no findings that are observable.  No abnormal lymphadenopathy.  No acute infectious changes.  No regional abnormality such as oral source for ear pain.  I have patient watch for any additional changes.  Patient with probably just degenerative inflammatory changes about the left shoulder shoulder and AC region.  Once again no redness swelling or acute infectious changes.  Patient aware that he has some renal insufficiency and told him to be very cautious using anti-inflammatories we will try him on Voltaren  gel initially.     I independently ordered and reviewed each unique test.             I interpreted the X-rays left shoulder x-ray shows osteoarthritic changes about the [facility] joint.              History     Patient here with 2 complaints.    Right ear pain that has now been present for some time.  No associated fever or trauma.  No ear drainage.  No pain to external ear movement.  No significant adjacent lymphadenopathy.  Has slight soreness to the infra-auricular region when he swallows.  No history of similar.  Left ear without complaint.  Also with complaint of left shoulder pain with certain movement.  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  No swelling.  Not really tender to touch.  No cervical radicular component.        Physical Exam     Vitals signs and nursing note reviewed:  Vitals:    05/16/24 1411   BP: 134/88   Pulse: 90   Resp: 16   Temp: 97.7 F (36.5 C)   TempSrc: Oral   SpO2: 98%   Weight: 99.8 kg (220 lb)   Height: 1.803 m (5' 11)      Physical Exam  Vitals and nursing note reviewed.   Constitutional:       Appearance: He is not toxic-appearing or diaphoretic.   HENT:      Head: Atraumatic.      Right Ear: Tympanic  membrane and ear canal normal.      Left Ear: Tympanic membrane and ear canal normal.      Ears:      Comments: At most slight fullness to the right TM but no redness or fluid  Left TM normal  Canals bilaterally normal  No pre or postauricular nodes     Nose: Nose normal.      Mouth/Throat:      Mouth: Mucous membranes are moist.      Pharynx: No oropharyngeal exudate or posterior oropharyngeal erythema.      Comments: No oral lesion or exudate or odor  Eyes:      General: No scleral icterus.  Neck:      Comments: Slightly sore at the lower part of the right SCM but no fluctuance redness or anything other than minimal soreness to palpation no mass effect  Cardiovascular:      Rate and Rhythm: Normal rate and regular rhythm.   Pulmonary:      Effort: Pulmonary effort is normal.  Musculoskeletal:      Comments: Actually full range of motion at the left shoulder minimal soreness though he does state certain movements aggravate this.  No overlying redness swelling or other acute changes   Lymphadenopathy:      Cervical: No cervical adenopathy.   Skin:     General: Skin is warm and dry.      Coloration: Skin is not jaundiced.   Neurological:      General: No focal deficit present.      Mental Status: He is alert. Mental status is at baseline.   Psychiatric:         Mood and Affect: Mood normal.         Behavior: Behavior normal.        Procedures     Procedures    Orders placed during this emergency department visit:     Orders Placed This Encounter   Procedures    XR SHOULDER LEFT (MIN 2 VIEWS)        Medications given during this emergency department visit:     Medications   dexAMETHasone  (DECADRON ) IntraMUSCular 10 mg (10 mg IntraMUSCular Given 05/16/24 1527)       New prescriptions:     New Prescriptions    No medications on file        Past History and Complexity:     Past Medical History:   Diagnosis Date    Crohn disease (HCC)         No past surgical history on file.     Social History     Socioeconomic History     Marital status: Single   Tobacco Use    Smoking status: Never    Smokeless tobacco: Never   Substance and Sexual Activity    Alcohol use: Never    Drug use: Never     Social Drivers of Health     Social Connections: Unknown (08/03/2022)    Received from [facility]    Social Connections     Frequency of Communication with Friends and Family: Not asked     Frequency of Social Gatherings with Friends and Family: Not asked   Intimate Partner Violence: Unknown (08/03/2022)    Received from [facility]    Intimate Partner Violence     Fear of Current or Ex-Partner: Not asked     Emotionally Abused: Not asked     Physically Abused: Not asked     Sexually Abused: Not asked   Housing Stability: Not At Risk (08/25/2020)    Received from [facility] Stability     Was there a time when you did not have a steady place to sleep: Unrecognized value     Worried that the place you are staying is making you sick: Unrecognized value        Previous Medications    DICLOFENAC  (VOLTAREN ) 75 MG EC TABLET    Take 1 tablet by mouth 2 times daily    IBUPROFEN  (ADVIL ;MOTRIN ) 800 MG TABLET    Take 1 tablet by mouth every 6 hours as needed for Pain        Results from this emergency department visit:      Results for orders placed or performed during the hospital encounter of 05/16/24   XR SHOULDER LEFT (MIN 2 VIEWS)    Narrative    Left Shoulder    INDICATION: Atraumatic pain    COMPARISON:  None    TECHNIQUE: Three views  of the left shoulder were obtained    FINDINGS: Distal clavicle intact. No acute separation of acromioclavicular  joint. Spurs on the undersurface of AC joint.    No acute fracture or dislocation of glenohumeral joint. No abnormal  calcifications around humeral head. No localized bony destruction.  Adjacent ribs intact.      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  No acute fracture or dislocation of glenohumeral joint.      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   No acute fracture or dislocation of glenohumeral joint.         Electronically signed by Ardeen Morones                   No results for input(s): COVID19 in the last 72 hours.     Voice dictation software was used during the making of this note.  This software is not perfect and grammatical and other typographical errors may be present.  This note has not been completely proofread for errors.     Therisa Toribio PARAS, MD  05/16/24 1935    "

## 2024-05-16 NOTE — ED Triage Notes (Signed)
"  Pt states he has been having left shoulder pain, denies injury     Pt states he is also having right ear pain   "

## 2024-05-16 NOTE — Discharge Instructions (Addendum)
"  Exam of the right ear and area around the ear does not show a definitive cause for your right ear pain.  No evidence of middle ear or ear canal infection.  Imaging of the left shoulder shows arthritis to the left AC joint.  Review of your records from recent care show continued baseline renal (kidney) function issues.  These appear to be stable.  Is important to use caution when taking medicines such as Advil  or Aleve  or Toradol  with this function issue.  Initial trial on Voltaren  gel to the shoulder region plus a lidocaine  patch to area.  If this does not give adequate relief after 1 week trial or so begin taking Naprosyn  for 7 to 10 days  "
